# Patient Record
Sex: Male | Born: 1970 | Race: Black or African American | Hispanic: No | Marital: Married | Smoking: Current every day smoker
Health system: Southern US, Community
[De-identification: ages and names within clinical notes are randomized; demographics above are authoritative.]

## PROBLEM LIST (undated history)

## (undated) DIAGNOSIS — G8929 Other chronic pain: Secondary | ICD-10-CM

## (undated) DIAGNOSIS — I2510 Atherosclerotic heart disease of native coronary artery without angina pectoris: Secondary | ICD-10-CM

## (undated) DIAGNOSIS — M549 Dorsalgia, unspecified: Secondary | ICD-10-CM

## (undated) DIAGNOSIS — E785 Hyperlipidemia, unspecified: Secondary | ICD-10-CM

## (undated) DIAGNOSIS — I10 Essential (primary) hypertension: Secondary | ICD-10-CM

## (undated) HISTORY — PX: CARDIAC SURGERY: SHX584

---

## 1998-03-03 ENCOUNTER — Emergency Department (HOSPITAL_COMMUNITY): Admission: EM | Admit: 1998-03-03 | Discharge: 1998-03-03 | Payer: Self-pay | Admitting: Emergency Medicine

## 2001-01-08 ENCOUNTER — Inpatient Hospital Stay (HOSPITAL_COMMUNITY): Admission: EM | Admit: 2001-01-08 | Discharge: 2001-01-11 | Payer: Self-pay | Admitting: *Deleted

## 2001-01-11 ENCOUNTER — Encounter: Payer: Self-pay | Admitting: Cardiology

## 2017-03-01 ENCOUNTER — Encounter (HOSPITAL_COMMUNITY): Payer: Self-pay | Admitting: Emergency Medicine

## 2017-03-01 ENCOUNTER — Emergency Department (HOSPITAL_COMMUNITY): Payer: Self-pay

## 2017-03-01 DIAGNOSIS — R0789 Other chest pain: Secondary | ICD-10-CM | POA: Insufficient documentation

## 2017-03-01 DIAGNOSIS — M545 Low back pain: Secondary | ICD-10-CM | POA: Insufficient documentation

## 2017-03-01 DIAGNOSIS — G8929 Other chronic pain: Secondary | ICD-10-CM | POA: Insufficient documentation

## 2017-03-01 DIAGNOSIS — I10 Essential (primary) hypertension: Secondary | ICD-10-CM | POA: Insufficient documentation

## 2017-03-01 DIAGNOSIS — I2510 Atherosclerotic heart disease of native coronary artery without angina pectoris: Secondary | ICD-10-CM | POA: Insufficient documentation

## 2017-03-01 LAB — BASIC METABOLIC PANEL
ANION GAP: 6 (ref 5–15)
BUN: 10 mg/dL (ref 6–20)
CALCIUM: 8.3 mg/dL — AB (ref 8.9–10.3)
CO2: 24 mmol/L (ref 22–32)
CREATININE: 0.92 mg/dL (ref 0.61–1.24)
Chloride: 104 mmol/L (ref 101–111)
Glucose, Bld: 109 mg/dL — ABNORMAL HIGH (ref 65–99)
Potassium: 3.2 mmol/L — ABNORMAL LOW (ref 3.5–5.1)
Sodium: 134 mmol/L — ABNORMAL LOW (ref 135–145)

## 2017-03-01 LAB — CBC
HCT: 43.6 % (ref 39.0–52.0)
HEMOGLOBIN: 14.5 g/dL (ref 13.0–17.0)
MCH: 26.9 pg (ref 26.0–34.0)
MCHC: 33.3 g/dL (ref 30.0–36.0)
MCV: 80.7 fL (ref 78.0–100.0)
PLATELETS: 206 10*3/uL (ref 150–400)
RBC: 5.4 MIL/uL (ref 4.22–5.81)
RDW: 15.1 % (ref 11.5–15.5)
WBC: 7.2 10*3/uL (ref 4.0–10.5)

## 2017-03-01 LAB — I-STAT TROPONIN, ED: Troponin i, poc: 0 ng/mL (ref 0.00–0.08)

## 2017-03-01 NOTE — ED Triage Notes (Signed)
Pt was sitting on his couch when he began to experience centeralized chest pain w/ back pain.  He took two nitro and his pain went from a 10 to a 6.  EMS gave 324 ASA and an additional nitro.  PN is now a 3.  Pt has been out of BP medication for three days.  Denies SOB, N/V/D, diaphoresis.

## 2017-03-02 ENCOUNTER — Emergency Department (HOSPITAL_COMMUNITY)
Admission: EM | Admit: 2017-03-02 | Discharge: 2017-03-02 | Disposition: A | Discharge status: Home / Self Care | Payer: Self-pay | Attending: Emergency Medicine | Admitting: Emergency Medicine

## 2017-03-02 DIAGNOSIS — G8929 Other chronic pain: Secondary | ICD-10-CM

## 2017-03-02 DIAGNOSIS — I10 Essential (primary) hypertension: Secondary | ICD-10-CM

## 2017-03-02 DIAGNOSIS — R0789 Other chest pain: Secondary | ICD-10-CM

## 2017-03-02 DIAGNOSIS — M545 Low back pain: Secondary | ICD-10-CM

## 2017-03-02 HISTORY — DX: Atherosclerotic heart disease of native coronary artery without angina pectoris: I25.10

## 2017-03-02 HISTORY — DX: Essential (primary) hypertension: I10

## 2017-03-02 LAB — I-STAT TROPONIN, ED: Troponin i, poc: 0 ng/mL (ref 0.00–0.08)

## 2017-03-02 MED ORDER — ENALAPRIL MALEATE 5 MG PO TABS
5.0000 mg | ORAL_TABLET | Freq: Once | ORAL | Status: AC
  Administered 2017-03-02: 5 mg via ORAL
  Filled 2017-03-02: qty 1

## 2017-03-02 MED ORDER — HYDROCHLOROTHIAZIDE 25 MG PO TABS
25.0000 mg | ORAL_TABLET | Freq: Every day | ORAL | Status: DC
  Administered 2017-03-02: 25 mg via ORAL
  Filled 2017-03-02: qty 1

## 2017-03-02 MED ORDER — HYDRALAZINE HCL 10 MG PO TABS
10.0000 mg | ORAL_TABLET | Freq: Once | ORAL | Status: AC
  Administered 2017-03-02: 10 mg via ORAL
  Filled 2017-03-02: qty 1

## 2017-03-02 MED ORDER — METOPROLOL TARTRATE 25 MG PO TABS
25.0000 mg | ORAL_TABLET | Freq: Once | ORAL | Status: AC
  Administered 2017-03-02: 25 mg via ORAL
  Filled 2017-03-02: qty 1

## 2017-03-02 MED ORDER — HYDROMORPHONE HCL 1 MG/ML IJ SOLN
1.0000 mg | Freq: Once | INTRAMUSCULAR | Status: AC
  Administered 2017-03-02: 1 mg via INTRAVENOUS
  Filled 2017-03-02: qty 1

## 2017-03-02 NOTE — ED Provider Notes (Signed)
TIME SEEN: 4:38 AM  CHIEF COMPLAINT: Back pain, chest pain  HPI: Patient is a 46 year old male with history of hypertension, "mild heart attack" in 1997 where he had a cardiac catheterization but did not have any stents placed and states he was told it was because of drug use who presents to the emergency department with complaints of lower back pain.  He states that he has had back pain in his lower back for "years". He describes it as sharp, severe with some pain that radiates into his right buttock and numbness in the right lateral thigh that is chronic. He denies any new injury to the back but thinks he exacerbated his chronic pain when he mowed the lawn several days ago. He does receive tramadol from his primary care provider. He denies any new numbness, tingling or focal weakness. No bowel or bladder incontinence. No urinary retention. No fever.   He also complains of intermittent chest pain but this seems to be an afterthought for him. He reports pain is intermittent and present for only several seconds at a time in the epigastric region without associated short of breath, nausea, vomiting, diaphoresis or dizziness.He is not having chest pain currently. Denies any recent drug use.  ROS: See HPI Constitutional: no fever  Eyes: no drainage  ENT: no runny nose   Cardiovascular:   chest pain  Resp: no SOB  GI: no vomiting GU: no dysuria Integumentary: no rash  Allergy: no hives  Musculoskeletal: no leg swelling  Neurological: no slurred speech ROS otherwise negative  PAST MEDICAL HISTORY/PAST SURGICAL HISTORY:  Past Medical History:  Diagnosis Date  . Coronary artery disease   . Hypertension     MEDICATIONS:  Prior to Admission medications   Not on File    ALLERGIES:  No Known Allergies  SOCIAL HISTORY:  Social History  Substance Use Topics  . Smoking status: Not on file  . Smokeless tobacco: Not on file  . Alcohol use Not on file    FAMILY HISTORY: No family history  on file.  EXAM: BP (!) 181/118   Pulse 72   Temp 98.7 F (37.1 C) (Oral)   Resp 18   Ht  (1.727 m)   Wt 109.8 kg (242 lb)   SpO2 100%   BMI 36.80 kg/m  CONSTITUTIONAL: Alert and oriented and responds appropriately to questions. Well-appearing; well-nourished HEAD: Normocephalic EYES: Conjunctivae clear, pupils appear equal, EOMI ENT: normal nose; moist mucous membranes NECK: Supple, no meningismus, no nuchal rigidity, no LAD  CARD: RRR; S1 and S2 appreciated; no murmurs, no clicks, no rubs, no gallops RESP: Normal chest excursion without splinting or tachypnea; breath sounds clear and equal bilaterally; no wheezes, no rhonchi, no rales, no hypoxia or respiratory distress, speaking full sentences ABD/GI: Normal bowel sounds; non-distended; soft, non-tender, no rebound, no guarding, no peritoneal signs, no hepatosplenomegaly BACK:  The back appears normal and is Mildly tender over the lower lumbar region without step-off or deformity,, there is no CVA tenderness EXT: Normal ROM in all joints; non-tender to palpation; no edema; normal capillary refill; no cyanosis, no calf tenderness or swelling    SKIN: Normal color for age and race; warm; no rash NEURO: Moves all extremities equally; Strength 5/5 in all 4 extremity, normal gait, no saddle anesthesia, normal sensation diffusely PSYCH: The patient's mood and manner are appropriate. Grooming and personal hygiene are appropriate.  MEDICAL DECISION MAKING: Patient here with complaints of chronic back pain. He also states that he has  had intermittent chest pain that seems very atypical. He is hypertensive here but states is because he has not had his blood pressure medication in the past several days. He does have medication at home as well as refills at the pharmacy. He is on HCTZ, Vasotec, Lopressor, hydralazine but cannot recall doses of any of these medications. He states that [place] and some  tramadol for his chronic back pain. He has no new focal neurologic deficits and no injury to suggest fracture. Doubt spinal stenosis, cauda equina, epidural abscess or hematoma, discitis, transverse myelitis. I do not feel he needs emergent imaging. We'll give him blood pressure medications and a dose of Dilaudid for pain relief. His labs obtained in triage are unremarkable including negative troponin and clear chest x-ray. Plan is to repeat second troponin. Low suspicion for ACS. Low suspicion for dissection or PE.  ED PROGRESS: Patient's second troponin is negative. His blood pressure is improving slowly. He reports feeling much better after Dilaudid and feels ready for discharge home. He states his family will pick him up from the emergency department and he will follow-up closely [facility] his [facility] [address] [facility]. We have discussed return precautions. Patient is comfortable with this plan.  At this time, I do not feel there is any life-threatening condition present. I have reviewed and discussed all results (EKG, imaging, lab, urine as appropriate) and exam findings with patient/family. I have reviewed nursing notes and appropriate previous records.  I feel the patient is safe to be discharged home without further emergent workup and can continue workup as an outpatient as needed. Discussed usual and customary return precautions. Patient/family verbalize understanding and are comfortable with this plan.  Outpatient follow-up has been provided if needed. All questions have been answered.      EKG Interpretation  Date/Time:  Monday March 01 2017 23:03:54 EDT Ventricular Rate:  76 PR Interval:  186 QRS Duration: 94 QT Interval:  340 QTC Calculation: 382 R Axis:   61 Text Interpretation:  Normal sinus rhythm Biatrial enlargement Incomplete right bundle branch block Nonspecific T wave abnormality Abnormal ECG No significant change since last tracing Confirmed by Seanna Sisler, Baxter HireKristen [phone]  on 03/02/2017 4:38:32 AM         Bemnet Trovato, Layla MawKristen N, DO 03/02/17 [id number]

## 2017-05-31 ENCOUNTER — Encounter (HOSPITAL_COMMUNITY): Payer: Self-pay | Admitting: Emergency Medicine

## 2017-05-31 ENCOUNTER — Other Ambulatory Visit: Payer: Self-pay

## 2017-05-31 ENCOUNTER — Emergency Department (HOSPITAL_COMMUNITY)
Admission: EM | Admit: 2017-05-31 | Discharge: 2017-05-31 | Disposition: A | Discharge status: Home / Self Care | Payer: Self-pay | Attending: Emergency Medicine | Admitting: Emergency Medicine

## 2017-05-31 DIAGNOSIS — I2510 Atherosclerotic heart disease of native coronary artery without angina pectoris: Secondary | ICD-10-CM | POA: Insufficient documentation

## 2017-05-31 DIAGNOSIS — I10 Essential (primary) hypertension: Secondary | ICD-10-CM | POA: Insufficient documentation

## 2017-05-31 DIAGNOSIS — F17290 Nicotine dependence, other tobacco product, uncomplicated: Secondary | ICD-10-CM | POA: Insufficient documentation

## 2017-05-31 DIAGNOSIS — R079 Chest pain, unspecified: Secondary | ICD-10-CM | POA: Insufficient documentation

## 2017-05-31 DIAGNOSIS — Z760 Encounter for issue of repeat prescription: Secondary | ICD-10-CM | POA: Insufficient documentation

## 2017-05-31 MED ORDER — TRAMADOL HCL 50 MG PO TABS: 50.0000 mg | ORAL_TABLET | Freq: Four times a day (QID) | ORAL | 0 refills | Status: AC | PRN

## 2017-05-31 MED ORDER — ENALAPRIL MALEATE 20 MG PO TABS: 20.0000 mg | ORAL_TABLET | Freq: Every day | ORAL | 0 refills | Status: DC

## 2017-05-31 MED ORDER — HYDRALAZINE HCL 50 MG PO TABS: 50.0000 mg | ORAL_TABLET | Freq: Three times a day (TID) | ORAL | 0 refills | Status: DC

## 2017-05-31 MED ORDER — TRAMADOL HCL 50 MG PO TABS
50.0000 mg | ORAL_TABLET | Freq: Once | ORAL | Status: AC
  Administered 2017-05-31: 50 mg via ORAL
  Filled 2017-05-31: qty 1

## 2017-05-31 MED ORDER — METOPROLOL TARTRATE 25 MG PO TABS
50.0000 mg | ORAL_TABLET | Freq: Once | ORAL | Status: AC
  Administered 2017-05-31: 50 mg via ORAL
  Filled 2017-05-31: qty 2

## 2017-05-31 MED ORDER — METOPROLOL TARTRATE 50 MG PO TABS: 50.0000 mg | ORAL_TABLET | Freq: Two times a day (BID) | ORAL | 0 refills | Status: DC

## 2017-05-31 MED ORDER — CARVEDILOL 12.5 MG PO TABS: 12.5000 mg | ORAL_TABLET | Freq: Two times a day (BID) | ORAL | 0 refills | Status: DC

## 2017-05-31 MED ORDER — HYDROCODONE-ACETAMINOPHEN 5-325 MG PO TABS
1.0000 | ORAL_TABLET | Freq: Once | ORAL | Status: AC
  Administered 2017-05-31: 1 via ORAL
  Filled 2017-05-31: qty 1

## 2017-05-31 MED ORDER — HYDROCHLOROTHIAZIDE 25 MG PO TABS: 25.0000 mg | ORAL_TABLET | Freq: Every day | ORAL | 0 refills | Status: DC

## 2017-05-31 MED ORDER — ENALAPRIL MALEATE 10 MG PO TABS
20.0000 mg | ORAL_TABLET | Freq: Once | ORAL | Status: AC
  Administered 2017-05-31: 20 mg via ORAL
  Filled 2017-05-31: qty 2

## 2017-05-31 NOTE — ED Triage Notes (Signed)
Pt presents by EMS for chest pain and medication refill of blood pressure medication. Pt states the chest pain occurred several hours ago and feels like a pressure in substernal chest. Pt states that he ran out of blood pressure medication yesterday and was unable to refill due to the weather.

## 2017-05-31 NOTE — ED Provider Notes (Signed)
[facility]-EMERGENCY DEPT Provider Note   CSN: [id number] Arrival date & time: 05/31/17  2030     History   Chief Complaint Chief Complaint  Patient presents with  . Chest Pain  . Medication Refill    HPI Raymond Valencia is a 46 y.o. male.  46 year old male presents requesting pain medication and medication refills. PMHx is significant for HTN. Patient reports that his last dose of several HTN medications was earlier today. He reports that he takes ultram on a daily basis for his chronic pain. He reports to this provider that if given prescriptions he will be able to fill them.   Of note, during MD evaluation, patient did not report chest pain or shortness of breath.    The history is provided by the patient.  Medication Refill  Medications/supplies requested:  Tramadol, enlapril, hydralzine, metoprolol, hctz, carvedilol Reason for request:  Medications ran out Medications taken before: yes - see home medications   Patient has complete original prescription information: yes     Past Medical History:  Diagnosis Date  . Coronary artery disease   . Hypertension     There are no active problems to display for this patient.   Past Surgical History:  Procedure Laterality Date  . CARDIAC SURGERY         Home Medications    Prior to Admission medications   Medication Sig Start Date End Date Taking? Authorizing Provider  carvedilol (COREG) 12.5 MG tablet Take 1 tablet (12.5 mg total) by mouth 2 (two) times daily with a meal. 05/31/17   Wynetta FinesMessick, Bowman Higbie C, MD  enalapril (VASOTEC) 20 MG tablet Take 1 tablet (20 mg total) by mouth daily. 05/31/17   Wynetta FinesMessick, Jakyrah Holladay C, MD  hydrALAZINE (APRESOLINE) 50 MG tablet Take 1 tablet (50 mg total) by mouth 3 (three) times daily. 05/31/17   Wynetta FinesMessick, Lior Cartelli C, MD  hydrochlorothiazide (HYDRODIURIL) 25 MG tablet Take 1 tablet (25 mg total) by mouth daily. 05/31/17   Wynetta FinesMessick, Vesna Kable C, MD  metoprolol tartrate  (LOPRESSOR) 50 MG tablet Take 1 tablet (50 mg total) by mouth 2 (two) times daily. 05/31/17   Wynetta FinesMessick, Saniya Tranchina C, MD  traMADol (ULTRAM) 50 MG tablet Take 1 tablet (50 mg total) by mouth every 6 (six) hours as needed. 05/31/17   Wynetta FinesMessick, Myleka Moncure C, MD    Family History History reviewed. No pertinent family history.  Social History Social History   Tobacco Use  . Smoking status: Light Tobacco Smoker    Types: Cigars  . Smokeless tobacco: Never Used  Substance Use Topics  . Alcohol use: No    Frequency: Never  . Drug use: No     Allergies   Patient has no known allergies.   Review of Systems Review of Systems  All other systems reviewed and are negative.    Physical Exam Updated Vital Signs BP (!) 168/102 (BP Location: Left Arm)   Pulse 86   Temp 98 F (36.7 C) (Oral)   Resp (!) 21   Ht 5\' 8"  (1.727 m)   Wt 108.9 kg (240 lb)   SpO2 99%   BMI 36.49 kg/m   Physical Exam   ED Treatments / Results  Labs (all labs ordered are listed, but only abnormal results are displayed) Labs Reviewed - No data to display  EKG  EKG Interpretation  Date/Time:  Monday May 31 2017 20:42:52 EST Ventricular Rate:  88 PR Interval:    QRS Duration: 91 QT Interval:  305 QTC Calculation: 369 R Axis:   69 Text Interpretation:  Sinus rhythm Right atrial enlargement Abnormal R-wave progression, early transition Nonspecific T abnormalities, lateral leads Confirmed by Kristine RoyalMessick, Lilybeth Vien [phone] on 05/31/2017 9:03:43 PM       Radiology No results found.  Procedures Procedures (including critical care time)  Medications Ordered in ED Medications  HYDROcodone-acetaminophen (NORCO/VICODIN) 5-325 MG per tablet 1 tablet (not administered)  traMADol (ULTRAM) tablet 50 mg (50 mg Oral Given 05/31/17 2142)  enalapril (VASOTEC) tablet 20 mg (20 mg Oral Given 05/31/17 2141)  metoprolol tartrate (LOPRESSOR) tablet 50 mg (50 mg Oral Given 05/31/17 2142)     Initial Impression /  Assessment and Plan / ED Course  I have reviewed the triage vital signs and the nursing notes.  Pertinent labs & imaging results that were available during my care of the patient were reviewed by me and considered in my medical decision making (see chart for details).    MSE complete  Patient is presenting with requests for multiple refills. He appears in no distress. The reported chest pain (in triage) was not reported again. Screening EKG was not suggestive of acute ischemia. Patient was admitted to a [facility] facility last month where his workup for chest pain was not suggestive of ACS. Close FU is advised. Strict return instructions given and understood.   Final Clinical Impressions(s) / ED Diagnoses   Final diagnoses:  Medication refill    ED Discharge Orders        Ordered    traMADol (ULTRAM) 50 MG tablet  Every 6 hours PRN     05/31/17 2242    enalapril (VASOTEC) 20 MG tablet  Daily     05/31/17 2242    hydrALAZINE (APRESOLINE) 50 MG tablet  3 times daily     05/31/17 2242    metoprolol tartrate (LOPRESSOR) 50 MG tablet  2 times daily     05/31/17 2242    hydrochlorothiazide (HYDRODIURIL) 25 MG tablet  Daily     05/31/17 2242    carvedilol (COREG) 12.5 MG tablet  2 times daily with meals     05/31/17 2242       Wynetta FinesMessick, Lathyn Griggs C, MD 05/31/17 2314

## 2017-05-31 NOTE — ED Notes (Signed)
Provider notified that pain was unchanged after pain medications.

## 2017-05-31 NOTE — ED Notes (Signed)
Bed: WA20 Expected date:  Expected time:  Means of arrival:  Comments: Chest and back pain

## 2017-06-01 ENCOUNTER — Observation Stay (HOSPITAL_BASED_OUTPATIENT_CLINIC_OR_DEPARTMENT_OTHER): Payer: Self-pay

## 2017-06-01 ENCOUNTER — Observation Stay (HOSPITAL_COMMUNITY)
Admission: EM | Admit: 2017-06-01 | Discharge: 2017-06-03 | Disposition: A | Discharge status: Home / Self Care | Payer: Self-pay | Attending: Internal Medicine | Admitting: Internal Medicine

## 2017-06-01 ENCOUNTER — Encounter (HOSPITAL_COMMUNITY): Payer: Self-pay | Admitting: Emergency Medicine

## 2017-06-01 ENCOUNTER — Emergency Department (HOSPITAL_COMMUNITY): Payer: Self-pay

## 2017-06-01 ENCOUNTER — Other Ambulatory Visit: Payer: Self-pay

## 2017-06-01 DIAGNOSIS — I252 Old myocardial infarction: Secondary | ICD-10-CM | POA: Insufficient documentation

## 2017-06-01 DIAGNOSIS — Z9114 Patient's other noncompliance with medication regimen: Secondary | ICD-10-CM

## 2017-06-01 DIAGNOSIS — R079 Chest pain, unspecified: Secondary | ICD-10-CM | POA: Diagnosis present

## 2017-06-01 DIAGNOSIS — Z87898 Personal history of other specified conditions: Secondary | ICD-10-CM

## 2017-06-01 DIAGNOSIS — I10 Essential (primary) hypertension: Secondary | ICD-10-CM | POA: Insufficient documentation

## 2017-06-01 DIAGNOSIS — G8929 Other chronic pain: Secondary | ICD-10-CM | POA: Insufficient documentation

## 2017-06-01 DIAGNOSIS — Z7982 Long term (current) use of aspirin: Secondary | ICD-10-CM | POA: Insufficient documentation

## 2017-06-01 DIAGNOSIS — R51 Headache: Secondary | ICD-10-CM | POA: Insufficient documentation

## 2017-06-01 DIAGNOSIS — R0789 Other chest pain: Principal | ICD-10-CM | POA: Insufficient documentation

## 2017-06-01 DIAGNOSIS — F17290 Nicotine dependence, other tobacco product, uncomplicated: Secondary | ICD-10-CM | POA: Insufficient documentation

## 2017-06-01 DIAGNOSIS — M549 Dorsalgia, unspecified: Secondary | ICD-10-CM | POA: Diagnosis present

## 2017-06-01 DIAGNOSIS — I2510 Atherosclerotic heart disease of native coronary artery without angina pectoris: Secondary | ICD-10-CM | POA: Insufficient documentation

## 2017-06-01 DIAGNOSIS — E7800 Pure hypercholesterolemia, unspecified: Secondary | ICD-10-CM | POA: Diagnosis present

## 2017-06-01 DIAGNOSIS — Z79899 Other long term (current) drug therapy: Secondary | ICD-10-CM | POA: Insufficient documentation

## 2017-06-01 DIAGNOSIS — M545 Low back pain: Secondary | ICD-10-CM | POA: Insufficient documentation

## 2017-06-01 DIAGNOSIS — R0602 Shortness of breath: Secondary | ICD-10-CM | POA: Insufficient documentation

## 2017-06-01 DIAGNOSIS — R110 Nausea: Secondary | ICD-10-CM | POA: Insufficient documentation

## 2017-06-01 HISTORY — DX: Other chronic pain: G89.29

## 2017-06-01 HISTORY — DX: Dorsalgia, unspecified: M54.9

## 2017-06-01 HISTORY — DX: Hyperlipidemia, unspecified: E78.5

## 2017-06-01 LAB — RAPID URINE DRUG SCREEN, HOSP PERFORMED
AMPHETAMINES: NOT DETECTED
BARBITURATES: NOT DETECTED
Benzodiazepines: NOT DETECTED
Cocaine: NOT DETECTED
Opiates: POSITIVE — AB
TETRAHYDROCANNABINOL: NOT DETECTED

## 2017-06-01 LAB — CBC WITH DIFFERENTIAL/PLATELET
BASOS ABS: 0 10*3/uL (ref 0.0–0.1)
Basophils Relative: 1 %
Eosinophils Absolute: 0.4 10*3/uL (ref 0.0–0.7)
Eosinophils Relative: 7 %
HEMATOCRIT: 43.6 % (ref 39.0–52.0)
HEMOGLOBIN: 15 g/dL (ref 13.0–17.0)
LYMPHS PCT: 38 %
Lymphs Abs: 2.5 10*3/uL (ref 0.7–4.0)
MCH: 28.4 pg (ref 26.0–34.0)
MCHC: 34.4 g/dL (ref 30.0–36.0)
MCV: 82.4 fL (ref 78.0–100.0)
MONO ABS: 0.9 10*3/uL (ref 0.1–1.0)
Monocytes Relative: 13 %
NEUTROS ABS: 2.7 10*3/uL (ref 1.7–7.7)
NEUTROS PCT: 41 %
Platelets: 225 10*3/uL (ref 150–400)
RBC: 5.29 MIL/uL (ref 4.22–5.81)
RDW: 15.9 % — AB (ref 11.5–15.5)
WBC: 6.5 10*3/uL (ref 4.0–10.5)

## 2017-06-01 LAB — COMPREHENSIVE METABOLIC PANEL
ALBUMIN: 3.5 g/dL (ref 3.5–5.0)
ALK PHOS: 67 U/L (ref 38–126)
ALT: 28 U/L (ref 17–63)
AST: 45 U/L — AB (ref 15–41)
Anion gap: 8 (ref 5–15)
BILIRUBIN TOTAL: 1.9 mg/dL — AB (ref 0.3–1.2)
BUN: 15 mg/dL (ref 6–20)
CALCIUM: 8.2 mg/dL — AB (ref 8.9–10.3)
CO2: 25 mmol/L (ref 22–32)
CREATININE: 0.89 mg/dL (ref 0.61–1.24)
Chloride: 106 mmol/L (ref 101–111)
GFR calc Af Amer: >60 mL/min (ref >60)
Glucose, Bld: 93 mg/dL (ref 65–99)
POTASSIUM: 5.5 mmol/L — AB (ref 3.5–5.1)
Sodium: 139 mmol/L (ref 135–145)
TOTAL PROTEIN: 6.5 g/dL (ref 6.5–8.1)

## 2017-06-01 LAB — CREATININE, SERUM
Creatinine, Ser: 0.81 mg/dL (ref 0.61–1.24)
GFR calc Af Amer: >60 mL/min (ref >60)
GFR calc non Af Amer: >60 mL/min (ref >60)

## 2017-06-01 LAB — CBC
HCT: 43 % (ref 39.0–52.0)
Hemoglobin: 14.3 g/dL (ref 13.0–17.0)
MCH: 27.6 pg (ref 26.0–34.0)
MCHC: 33.3 g/dL (ref 30.0–36.0)
MCV: 83 fL (ref 78.0–100.0)
PLATELETS: 200 10*3/uL (ref 150–400)
RBC: 5.18 MIL/uL (ref 4.22–5.81)
RDW: 15.8 % — AB (ref 11.5–15.5)
WBC: 5.5 10*3/uL (ref 4.0–10.5)

## 2017-06-01 LAB — BRAIN NATRIURETIC PEPTIDE: B NATRIURETIC PEPTIDE 5: 21.3 pg/mL (ref 0.0–100.0)

## 2017-06-01 LAB — ECHOCARDIOGRAM COMPLETE
Height: 68 in
WEIGHTICAEL: 3872 [oz_av]

## 2017-06-01 LAB — TROPONIN I: Troponin I: <0.03 ng/mL (ref <0.03)

## 2017-06-01 MED ORDER — ENOXAPARIN SODIUM 40 MG/0.4ML ~~LOC~~ SOLN: 40.0000 mg | SUBCUTANEOUS | Status: DC

## 2017-06-01 MED ORDER — METHOCARBAMOL 500 MG PO TABS
500.0000 mg | ORAL_TABLET | Freq: Four times a day (QID) | ORAL | Status: DC | PRN
  Administered 2017-06-01: 500 mg via ORAL
  Filled 2017-06-01 (×2): qty 1

## 2017-06-01 MED ORDER — METHOCARBAMOL 500 MG PO TABS
500.0000 mg | ORAL_TABLET | Freq: Once | ORAL | Status: AC
  Administered 2017-06-01: 500 mg via ORAL
  Filled 2017-06-01: qty 1

## 2017-06-01 MED ORDER — HYDRALAZINE HCL 50 MG PO TABS
50.0000 mg | ORAL_TABLET | Freq: Two times a day (BID) | ORAL | Status: DC
  Administered 2017-06-01 – 2017-06-02 (×3): 50 mg via ORAL
  Filled 2017-06-01 (×3): qty 1

## 2017-06-01 MED ORDER — CARVEDILOL 12.5 MG PO TABS: 12.5000 mg | ORAL_TABLET | Freq: Two times a day (BID) | ORAL | Status: DC

## 2017-06-01 MED ORDER — OXYCODONE HCL 5 MG PO TABS
5.0000 mg | ORAL_TABLET | Freq: Four times a day (QID) | ORAL | Status: DC | PRN
Start: 2017-06-01 — End: 2017-06-03
  Administered 2017-06-01 – 2017-06-03 (×8): 5 mg via ORAL
  Filled 2017-06-01 (×8): qty 1

## 2017-06-01 MED ORDER — METOPROLOL TARTRATE 50 MG PO TABS
50.0000 mg | ORAL_TABLET | Freq: Two times a day (BID) | ORAL | Status: DC
  Administered 2017-06-01 – 2017-06-02 (×2): 50 mg via ORAL
  Filled 2017-06-01 (×2): qty 1
  Filled 2017-06-01: qty 2

## 2017-06-01 MED ORDER — ENOXAPARIN SODIUM 40 MG/0.4ML ~~LOC~~ SOLN
40.0000 mg | SUBCUTANEOUS | Status: DC
  Administered 2017-06-01 – 2017-06-03 (×3): 40 mg via SUBCUTANEOUS
  Filled 2017-06-01 (×3): qty 0.4

## 2017-06-01 MED ORDER — TRAMADOL HCL 50 MG PO TABS
100.0000 mg | ORAL_TABLET | Freq: Four times a day (QID) | ORAL | Status: DC | PRN
  Administered 2017-06-01 – 2017-06-03 (×8): 100 mg via ORAL
  Filled 2017-06-01 (×8): qty 2

## 2017-06-01 MED ORDER — ASPIRIN EC 325 MG PO TBEC
325.0000 mg | DELAYED_RELEASE_TABLET | Freq: Every day | ORAL | Status: DC
  Administered 2017-06-01 – 2017-06-03 (×3): 325 mg via ORAL
  Filled 2017-06-01 (×3): qty 1

## 2017-06-01 MED ORDER — ONDANSETRON HCL 4 MG/2ML IJ SOLN: 4.0000 mg | Freq: Four times a day (QID) | INTRAMUSCULAR | Status: DC | PRN

## 2017-06-01 MED ORDER — ACETAMINOPHEN 325 MG PO TABS: 650.0000 mg | ORAL_TABLET | ORAL | Status: DC | PRN

## 2017-06-01 MED ORDER — ENALAPRIL MALEATE 10 MG PO TABS
20.0000 mg | ORAL_TABLET | Freq: Every day | ORAL | Status: DC
  Administered 2017-06-02: 20 mg via ORAL
  Filled 2017-06-01 (×2): qty 2

## 2017-06-01 MED ORDER — TRAMADOL HCL 50 MG PO TABS: 50.0000 mg | ORAL_TABLET | Freq: Four times a day (QID) | ORAL | Status: DC | PRN

## 2017-06-01 MED ORDER — NITROGLYCERIN 0.4 MG SL SUBL
0.4000 mg | SUBLINGUAL_TABLET | SUBLINGUAL | Status: DC | PRN
  Administered 2017-06-02 – 2017-06-03 (×3): 0.4 mg via SUBLINGUAL
  Filled 2017-06-01 (×3): qty 1

## 2017-06-01 MED ORDER — NICOTINE 21 MG/24HR TD PT24
21.0000 mg | MEDICATED_PATCH | Freq: Every day | TRANSDERMAL | Status: DC
  Administered 2017-06-01 – 2017-06-03 (×3): 21 mg via TRANSDERMAL
  Filled 2017-06-01 (×3): qty 1

## 2017-06-01 MED ORDER — HYDROCHLOROTHIAZIDE 25 MG PO TABS
25.0000 mg | ORAL_TABLET | Freq: Every day | ORAL | Status: DC
  Administered 2017-06-02 – 2017-06-03 (×2): 25 mg via ORAL
  Filled 2017-06-01 (×3): qty 1

## 2017-06-01 NOTE — Care Management Note (Signed)
Case Management Note  CM consulted for no PCP and no ins.  CM noted that [place] and has [facility] who's office has helped pt with medications in the past.  Placed Dr. Loretha StaplerWofford on AVS for time of transition home for pt to make a follow up appointment and call with medication issues.  Additionally pt was a "no show" to a cardiology appointment in Sept.  No further CM needs noted at this time.

## 2017-06-01 NOTE — Consult Note (Signed)
Cardiology Consultation:   Patient ID: Raymond Valencia; [id number]; December 20, 1970   Admit date: 06/01/2017 Date of Consult: 06/01/2017  Primary Care Provider: Patient, No Pcp Per Primary Cardiologist: New- Dr. Delton SeeNelson  Patient Profile:   Raymond Valencia is a 46 y.o. male with a hx of HTN and chronic low back pain who is being seen today for the evaluation of chest pain at the request of Dr. Hanley BenAlekh.  History of Present Illness:   Raymond Valencia presented for chest pain, back pain and a constant headache. He has been having intermittent substernal chest pain for "a couple of days". The discomfort is pressure that radiates to his jaw and left arm and lasts about 10-15 minutes. It is not related to activity, can awaken him at night. He also admits to associated dyspnea and occ associated nausea and lightheadedness. He has had intermittent palpitations "flutterling" lasting for a few seconds for many years. He is having low back pain that he states is unrelieved by the pain meds that he has been given so far.   Raymond Valencia does not work. He has no insurance and is trying to get disability. [place] in [place] and is here in [place] trying to reconcile with his wife. He has been out of his meds for at least 3 day, although I suspect longer. He complains of a constant headache that he associates with elevated BP that is worse with activity like having sexual relations with his wife or when she causes him stress. He is very sedentary, staying home most of the time.   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 He reports that he had a cardiac cath that showed that it was the cocaine, not blockage that caused his MI. He states that he has been clean of cocaine for 14 years. He smokes about 4 Black and Milds (thins cigars) per day, no cigarettes. He drinks about 2 beers per week.   Raymond Valencia was recently admitted to [facility] 01/23/17-01/25/17 for  chest pain. EKG showed changes consistent with OVH, without ischemic changes. Troponin peaked at 0.065. BP was elevated at 159/111 and his symptoms were felt to possibly due to hypertensive emergency. A dobutamine stress test was ordered, but the patient refused on 2 occasions. He was continued on enalapirl, HCTZ. He was started ob amlodipine 10 mg, labetalol 100 mg bid and NTG prn. Lasix and metoprolol were stopped. He was for cardiology follow pu but apparently was a no show.   He was again admitted 05/04/17-05/05/17 for hypertensive urgency at [facility]. He complained of severe headache. He was continued on enalapril and hydralazine and carvedilol and HCTZ were added. OSA was suspected and outpatient sleep study was recommended.   Past Medical History:  Diagnosis Date  . Chronic back pain   . Coronary artery disease   . Hypertension     Past Surgical History:  Procedure Laterality Date  . CARDIAC SURGERY       Home Medications:  Prior to Admission medications   Medication Sig Start Date End Date Taking? Authorizing Provider  aspirin 81 MG chewable tablet Chew 81 mg by mouth daily.   Yes [provider]  carvedilol (COREG) 12.5 MG tablet Take 1 tablet (12.5 mg total) by mouth 2 (two) times daily with a meal. 05/31/17  Yes Messick, Noralyn PickPeter C, MD  enalapril (VASOTEC) 20 MG tablet Take 1 tablet (20 mg total) by mouth daily. 05/31/17  Yes Wynetta Fines, MD  hydrALAZINE (APRESOLINE) 50 MG tablet Take 1 tablet (50 mg total) by mouth 3 (three) times daily. Patient taking differently: Take 50 mg by mouth 2 (two) times daily.  05/31/17  Yes Wynetta Fines, MD  hydrochlorothiazide (HYDRODIURIL) 25 MG tablet Take 1 tablet (25 mg total) by mouth daily. 05/31/17  Yes Wynetta Fines, MD  metoprolol tartrate (LOPRESSOR) 50 MG tablet Take 1 tablet (50 mg total) by mouth 2 (two) times daily. 05/31/17  Yes Wynetta Fines, MD  nitroGLYCERIN (NITROSTAT) 0.4 MG SL tablet Place 0.4 mg under the  tongue every 5 (five) minutes as needed for chest pain.   Yes [provider]  traMADol (ULTRAM) 50 MG tablet Take 1 tablet (50 mg total) by mouth every 6 (six) hours as needed. Patient taking differently: Take 50 mg by mouth every 6 (six) hours as needed for moderate pain.  05/31/17  Yes Wynetta Fines, MD    Inpatient Medications: Scheduled Meds: . aspirin EC  325 mg Oral Daily  . enalapril  20 mg Oral Daily  . enoxaparin (LOVENOX) injection  40 mg Subcutaneous Q24H  . hydrALAZINE  50 mg Oral BID  . hydrochlorothiazide  25 mg Oral Daily  . metoprolol tartrate  50 mg Oral BID   Continuous Infusions:  PRN Meds: acetaminophen, nitroGLYCERIN, ondansetron (ZOFRAN) IV, traMADol  Allergies:   No Known Allergies  Social History:   Social History   Socioeconomic History  . Marital status: Married    Spouse name: Not on file  . Number of children: Not on file  . Years of education: Not on file  . Highest education level: Not on file  Social Needs  . Financial resource strain: Not on file  . Food insecurity - worry: Not on file  . Food insecurity - inability: Not on file  . Transportation needs - medical: Not on file  . Transportation needs - non-medical: Not on file  Occupational History  . Not on file  Tobacco Use  . Smoking status: Current Every Day Smoker    Types: Cigars  . Smokeless tobacco: Never Used  Substance and Sexual Activity  . Alcohol use: No    Frequency: Never  . Drug use: No    Comment: former  . Sexual activity: Yes  Other Topics Concern  . Not on file  Social History Narrative  . Not on file    Family History:    Family History  Problem Relation Age of Onset  . Hypertension Other   . Hypertension Mother   . Diabetes Mother   . Hypertension Sister   . Bone cancer Brother   . Hypertension Sister   . Hypertension Sister   . Hypertension Sister      ROS:  Please see the history of present illness.  ROS  All other ROS reviewed and  negative.     Physical Exam/Data:   Vitals:   06/01/17 0845 06/01/17 0900 06/01/17 0915 06/01/17 0930  BP: 134/84 110/72 112/61 123/73  Pulse: 71 62 64 66  Resp:    18  Temp:      TempSrc:      SpO2: 95% 94% 96% 95%  Weight:      Height:       No intake or output data in the 24 hours ending 06/01/17 1005 Filed Weights   06/01/17 0047  Weight: 242 lb (109.8 kg)   Body mass index is 36.8 kg/m.  General:  Well nourished, well developed, in no acute distress HEENT: normal Lymph: no adenopathy Neck: no JVD Endocrine:  No thryomegaly Vascular: No carotid bruits; FA pulses 2+ bilaterally without bruits  Cardiac:  normal S1, S2; RRR; no murmur  Lungs:  clear to auscultation bilaterally, no wheezing, rhonchi or rales  Abd: soft, nontender, no hepatomegaly  Ext: no edema Musculoskeletal:  No deformities, BUE and BLE strength normal and equal Skin: warm and dry  Neuro:  CNs 2-12 intact, no focal abnormalities noted Psych:  Normal affect   EKG:  The EKG was personally reviewed and demonstrates:  Sinus rhythm, 73 bpm, Biatrial enlargement, early transition r waves, Nonspecific T abnormalities in lateral leads, Minimal ST elevation, anterior leads Telemetry:  Telemetry was personally reviewed and demonstrates:  Sinus rhythm 60's-70's  Relevant CV Studies:  Echocardiogram 06/01/17 Study Conclusions - Left ventricle: The cavity size was normal. Wall thickness was   increased in a pattern of mild LVH. Systolic function was normal.   The estimated ejection fraction was in the range of 60% to 65%.   Wall motion was normal; there were no regional wall motion   abnormalities. Left ventricular diastolic function parameters   were normal. - Mitral valve: Calcified annulus.  Impressions: - Normal LV systolic and diastolic function; mild LVH.  Echocardiogram 05/04/17 at [facility] Interpretation Summary A complete two-dimensional transthoracic echocardiogram with color flow Doppler and  spectral Doppler was performed. The study was technically adequate. The left ventricle is normal in size. There is mild asymmetric left ventricular hypertrophy with normal wall motion and ejection fraction 60-65%.. The left ventricular diastolic function is normal. Estimation of right ventricular systolic pressure is not possible.   Laboratory Data:  Chemistry Recent Labs  Lab 06/01/17 0108 06/01/17 0420  NA 139  --   K 5.5*  --   CL 106  --   CO2 25  --   GLUCOSE 93  --   BUN 15  --   CREATININE 0.89 0.81  CALCIUM 8.2*  --   GFRNONAA >60 >60  GFRAA >60 >60  ANIONGAP 8  --     Recent Labs  Lab 06/01/17 0108  PROT 6.5  ALBUMIN 3.5  AST 45*  ALT 28  ALKPHOS 67  BILITOT 1.9*   Hematology Recent Labs  Lab 06/01/17 0108 06/01/17 0420  WBC 6.5 5.5  RBC 5.29 5.18  HGB 15.0 14.3  HCT 43.6 43.0  MCV 82.4 83.0  MCH 28.4 27.6  MCHC 34.4 33.3  RDW 15.9* 15.8*  PLT 225 200   Cardiac Enzymes Recent Labs  Lab 06/01/17 0138  TROPONINI <0.03   No results for input(s): TROPIPOC in the last 168 hours.  BNP Recent Labs  Lab 06/01/17 0108  BNP 21.3    DDimer No results for input(s): DDIMER in the last 168 hours.  Radiology/Studies:  Dg Chest 2 View  Result Date: 06/01/2017 CLINICAL DATA:  Chest pain and dyspnea for 48 hours. EXAM: CHEST  2 VIEW COMPARISON:  03/01/2017 FINDINGS: Stable left hemidiaphragm elevation. The lungs are clear. The pulmonary vasculature is normal. Hilar, mediastinal and cardiac contours are unremarkable and unchanged. No pleural effusion. IMPRESSION: No active cardiopulmonary disease. Electronically Signed   By: Ellery Plunk M.D.   On: 06/01/2017 02:02    Assessment and Plan:   1. Chest pain -Non-exertional chest pain with some typical and atypical features. Pt has hx of cocaine related MI in 1998. No longer takes drugs.  -Initial troponin negative. BNP 21.3 -WBC 6.5,  Hgb 15.0 -K+ 5.5,  SCr  0.89 -EKG with non-specific T wave  changes in lateral leads.  -Echo today shows Normal LV systolic and diastolic function; mild LVH. No regional wall motion abnormalities.  -Suspect that his symptoms are related to uncontrolled hypertension. This is his third admission this year for chest pain and/or uncontrolled hypertension.  -CVD risk factors include tobacco use, Uncontrolled HTN.  Hgb A1c 5.8.  -Would advise to trend troponins and if remain negative, do nuclear stress test tomorrow. Pt does not want to do ST today as he "really needs to eat". He agrees to do ST tomorrow.  2. Hypertension -BP elevated on presentation at 159/106, now improved on meds -Home meds include enalapril 20 mg daily, hydralazine 50 mg TID, hydrochlorothiazide 25 mg daily, metoprolol 50 mg bid--Pt has been out of his meds for about 3 days, although I suspect longer.  -Echo with evidence of LVH.  -Advised compliance with medications and follow up to optimize BP control.  3. Tobacco use -Advise cessation  For questions or updates, please contact [facility] Please consult [email or web address] for contact info under Cardiology/STEMI.   Signed, Berton BonJanine Hammond, NP  06/01/2017 10:05 AM   The patient was seen, examined and discussed with Berton BonJanine Hammond, NP-C and I agree with the above.   46 y.o. male with a hx of HTN and chronic low back pain who is being seen today for the evaluation of chest pain, back pain and a constant headache. He has been having intermittent substernal chest pain for "a couple of days". It feels like pressure, the patient doesn't describe it but when asked questions he has all the symptoms - yes to radiating to his jaw and back, also DOE after walking a block for about a year. The pain is at rest, while in bed. No palpitations or syncope, no LE edema, orthopnea, PND.   He is trying to get disability blaming his back pain. He is not working. He doesn't have a  Raymond Valencia does not work. He has no insurance, he was not taking his  medications, h/o cocaine abuse.  Physical exam shows no JVD, S1,2, no murmur or rub, clear lungs, no LE edema and good pulses.  ECG shows SR, no significant ST T wave abnormalities, unchanged from prior. Troponin is negative x 1. BP is elevated.  Echocardiogram shows - Normal LV systolic and diastolic function; mild LVH.  We will plan to continue cycling troponin, plan for a Lexiscan nuclear stress test tomorrow.  Agree with restarting BP medications - enalapril, hydralazine, metoprolol. Uptitrate as needed for BP control.  Raymond AlexanderKatarina Lui Bellis, MD 06/01/2017

## 2017-06-01 NOTE — ED Provider Notes (Signed)
[facility]-EMERGENCY DEPT Provider Note   CSN: [id number] Arrival date & time: 06/01/17  0035     History   Chief Complaint Chief Complaint  Patient presents with  . Chest Pain  . Back Pain    HPI Raymond Valencia is a 46 y.o. male.  Patient presents to the emergency department for evaluation of chest pain.  Patient was seen in the ER earlier today.  He reports that he has been having intermittent episodes of severe, substernal chest pain that radiates to the left jaw and arm.  This is accompanied by diaphoresis and shortness of breath. This can happen at rest, does not necessarily happen with exertion.  He does, however, report that he has been experiencing severe dyspnea on exertion.  He reports that he had a heart attack when he was younger secondary to cocaine abuse, but has not used cocaine in years.  He has been using multiple nitroglycerin today for chest pain and elevated blood pressure.      Past Medical History:  Diagnosis Date  . Chronic back pain   . Coronary artery disease   . Hypertension     There are no active problems to display for this patient.   Past Surgical History:  Procedure Laterality Date  . CARDIAC SURGERY         Home Medications    Prior to Admission medications   Medication Sig Start Date End Date Taking? Authorizing Provider  carvedilol (COREG) 12.5 MG tablet Take 1 tablet (12.5 mg total) by mouth 2 (two) times daily with a meal. 05/31/17   Wynetta Fines, MD  enalapril (VASOTEC) 20 MG tablet Take 1 tablet (20 mg total) by mouth daily. 05/31/17   Wynetta Fines, MD  hydrALAZINE (APRESOLINE) 50 MG tablet Take 1 tablet (50 mg total) by mouth 3 (three) times daily. 05/31/17   Wynetta Fines, MD  hydrochlorothiazide (HYDRODIURIL) 25 MG tablet Take 1 tablet (25 mg total) by mouth daily. 05/31/17   Wynetta Fines, MD  metoprolol tartrate (LOPRESSOR) 50 MG tablet Take 1 tablet (50 mg total) by mouth 2 (two)  times daily. 05/31/17   Wynetta Fines, MD  traMADol (ULTRAM) 50 MG tablet Take 1 tablet (50 mg total) by mouth every 6 (six) hours as needed. 05/31/17   Wynetta Fines, MD    Family History Family History  Problem Relation Age of Onset  . Hypertension Other     Social History Social History   Tobacco Use  . Smoking status: Current Every Day Smoker    Types: Cigars  . Smokeless tobacco: Never Used  Substance Use Topics  . Alcohol use: No    Frequency: Never  . Drug use: No    Comment: former     Allergies   Patient has no known allergies.   Review of Systems Review of Systems  Respiratory: Positive for shortness of breath.   Cardiovascular: Positive for chest pain.  All other systems reviewed and are negative.    Physical Exam Updated Vital Signs BP (!) 136/96   Pulse 74   Temp 98.6 F (37 C) (Oral)   Resp 20   Ht 5\' 8"  (1.727 m)   Wt 109.8 kg (242 lb)   SpO2 100%   BMI 36.80 kg/m   Physical Exam  Constitutional: He is oriented to person, place, and time. He appears well-developed and well-nourished. No distress.  HENT:  Head: Normocephalic and atraumatic.  Right Ear: Hearing normal.  Left Ear: Hearing normal.  Nose: Nose normal.  Mouth/Throat: Oropharynx is clear and moist and mucous membranes are normal.  Eyes: Conjunctivae and EOM are normal. Pupils are equal, round, and reactive to light.  Neck: Normal range of motion. Neck supple.  Cardiovascular: Regular rhythm, S1 normal and S2 normal. Exam reveals no gallop and no friction rub.  No murmur heard. Pulmonary/Chest: Effort normal and breath sounds normal. No respiratory distress. He exhibits no tenderness.  Abdominal: Soft. Normal appearance and bowel sounds are normal. There is no hepatosplenomegaly. There is no tenderness. There is no rebound, no guarding, no tenderness at McBurney's point and negative Murphy's sign. No hernia.  Musculoskeletal: Normal range of motion.  Neurological: He is  alert and oriented to person, place, and time. He has normal strength. No cranial nerve deficit or sensory deficit. Coordination normal. GCS eye subscore is 4. GCS verbal subscore is 5. GCS motor subscore is 6.  Skin: Skin is warm, dry and intact. No rash noted. No cyanosis.  Psychiatric: He has a normal mood and affect. His speech is normal and behavior is normal. Thought content normal.  Nursing note and vitals reviewed.    ED Treatments / Results  Labs (all labs ordered are listed, but only abnormal results are displayed) Labs Reviewed  CBC WITH DIFFERENTIAL/PLATELET - Abnormal; Notable for the following components:      Result Value   RDW 15.9 (*)    All other components within normal limits  COMPREHENSIVE METABOLIC PANEL - Abnormal; Notable for the following components:   Potassium 5.5 (*)    Calcium 8.2 (*)    AST 45 (*)    Total Bilirubin 1.9 (*)    All other components within normal limits  BRAIN NATRIURETIC PEPTIDE  TROPONIN I    EKG  EKG Interpretation  Date/Time:  Tuesday June 01 2017 01:19:57 EST Ventricular Rate:  73 PR Interval:    QRS Duration: 97 QT Interval:  388 QTC Calculation: 428 R Axis:   72 Text Interpretation:  Sinus rhythm Biatrial enlargement RSR' in V1 or V2, right VCD or RVH Nonspecific T abnormalities, lateral leads Minimal ST elevation, anterior leads No significant change since last tracing Confirmed by Gilda CreasePollina, Christopher J [phone] on 06/01/2017 1:33:10 AM       Radiology Dg Chest 2 View  Result Date: 06/01/2017 CLINICAL DATA:  Chest pain and dyspnea for 48 hours. EXAM: CHEST  2 VIEW COMPARISON:  03/01/2017 FINDINGS: Stable left hemidiaphragm elevation. The lungs are clear. The pulmonary vasculature is normal. Hilar, mediastinal and cardiac contours are unremarkable and unchanged. No pleural effusion. IMPRESSION: No active cardiopulmonary disease. Electronically Signed   By: Ellery Plunkaniel R Mitchell M.D.   On: 06/01/2017 02:02     Procedures Procedures (including critical care time)  Medications Ordered in ED Medications - No data to display   Initial Impression / Assessment and Plan / ED Course  I have reviewed the triage vital signs and the nursing notes.  Pertinent labs & imaging results that were available during my care of the patient were reviewed by me and considered in my medical decision making (see chart for details).     Patient with history of coronary artery disease presents to the ER for evaluation of chest pain.  Patient was seen in the emergency department earlier today.  He was complaining of chest pain and shortness of breath, but apparently his symptoms were not present at time of arrival.  Patient was given prescriptions for his blood pressure medications  and discharged.  Since then he has been experiencing intermittent episodes of heaviness over the central chest with radiation to the jaw and arm.  Symptoms are nonexertional, but he does have severe dyspnea on exertion independent of the chest pain.  Reviewing his records reveals that he was admitted to [facility] in August with an acute coronary syndrome.  He had elevated troponins at that time, but not studied.  The note references outpatient adenosine stress test, but patient reports that this was never performed.  Patient experiencing intermittent episodes of mixed typical and atypical chest pain, requires further evaluation.  Final Clinical Impressions(s) / ED Diagnoses   Final diagnoses:  Chest pain, unspecified type    ED Discharge Orders    None       Pollina, Canary Brimhristopher J, MD 06/01/17 [phone]
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
## 2017-06-01 NOTE — ED Notes (Signed)
Echocardiogram at bedside.

## 2017-06-01 NOTE — ED Notes (Signed)
MD at bedside. 

## 2017-06-01 NOTE — ED Notes (Signed)
Patient is aware a urine sample is needed and has a urinal at bedside.  

## 2017-06-01 NOTE — ED Triage Notes (Signed)
Pt is c/o chest pain and back pain  Pt was brought in earlier by EMS with same complaints  Pt had a work up and was given prescriptions for his blood pressure medication and pain medication but has not had a chance to get them filled  Pt has been in lobby since he was discharged as he has no way home

## 2017-06-01 NOTE — ED Notes (Signed)
Patient requesting pain medicine for back pain. This RN explained that the patient has orders for tramadol and can have it again at 1300. Patient stated "it didn't do anything when I took it at 7am." This RN notified Dr. Hanley BenAlekh. Awaiting new orders for pain meds for patient.

## 2017-06-01 NOTE — Progress Notes (Addendum)
Patient ID: Raymond Valencia, male   DOB: [birth year], 46 y.o.   MRN: [id number] Patient was admitted early this morning for chest pain and and back pain.  Cardiology consult has been requested.  I have seen the patient and examined him at bedside and discussed the plan of care.  Repeat a.m. labs.  Will order PT eval for back pain.  Continue pain management.

## 2017-06-01 NOTE — Progress Notes (Signed)
  Echocardiogram 2D Echocardiogram has been performed.  Raymond Valencia, Raymond Valencia M 06/01/2017, 8:36 AM

## 2017-06-01 NOTE — Plan of Care (Signed)
  Progressing Health Behavior/Discharge Planning: Ability to manage health-related needs will improve 06/01/2017 2300 - Progressing by Cristela FeltSteffens, Lafawn Lenoir P, RN Clinical Measurements: Ability to maintain clinical measurements within normal limits will improve 06/01/2017 2300 - Progressing by Cristela FeltSteffens, Harmon Bommarito P, RN Will remain free from infection 06/01/2017 2300 - Progressing by Cristela FeltSteffens, Deniro Laymon P, RN Diagnostic test results will improve 06/01/2017 2300 - Progressing by Cristela FeltSteffens, Karilynn Carranza P, RN Respiratory complications will improve 06/01/2017 2300 - Progressing by Cristela FeltSteffens, Berl Bonfanti P, RN Cardiovascular complication will be avoided 06/01/2017 2300 - Progressing by Cristela FeltSteffens, Keith Cancio P, RN Activity: Risk for activity intolerance will decrease 06/01/2017 2300 - Progressing by Cristela FeltSteffens, Viliami Bracco P, RN Nutrition: Adequate nutrition will be maintained 06/01/2017 2300 - Progressing by Cristela FeltSteffens, Narely Nobles P, RN Coping: Level of anxiety will decrease 06/01/2017 2300 - Progressing by Cristela FeltSteffens, Natthew Marlatt P, RN Elimination: Will not experience complications related to urinary retention 06/01/2017 2300 - Progressing by Cristela FeltSteffens, Cornesha Radziewicz P, RN Pain Managment: General experience of comfort will improve 06/01/2017 2300 - Progressing by Cristela FeltSteffens, Charmeka Freeburg P, RN Safety: Ability to remain free from injury will improve 06/01/2017 2300 - Progressing by Cristela FeltSteffens, Roshan Roback P, RN Skin Integrity: Risk for impaired skin integrity will decrease 06/01/2017 2300 - Progressing by Cristela FeltSteffens, Mckaylie Vasey P, RN

## 2017-06-01 NOTE — ED Notes (Signed)
ED TO INPATIENT HANDOFF REPORT  Name/Age/Gender Raymond Valencia 46 y.o. male  Code Status    Code Status Orders  (From admission, onward)        Start     Ordered   06/01/17 0420  Full code  Continuous     06/01/17 0422    Code Status History    Date Active Date Inactive Code Status Order ID Comments User Context   This patient has a current code status but no historical code status.      Home/SNF/Other Home  Chief Complaint Chest Pain; Back Pain  Level of Care/Admitting Diagnosis ED Disposition    ED Disposition Condition Comment   Admit  Hospital Area: [facility] [[id number]]  Level of Care: Telemetry [5]  Admit to tele based on following criteria: Monitor for Ischemic changes  Diagnosis: Chest pain [400867]  Admitting Physician: Rise Patience [phone]  Attending Physician: Rise Patience [phone]  PT Class (Do Not Modify): Observation [104]  PT Acc Code (Do Not Modify): Observation [10022]       Medical History Past Medical History:  Diagnosis Date  . Chronic back pain   . Coronary artery disease   . Hypertension     Allergies No Known Allergies  IV Location/Drains/Wounds Patient Lines/Drains/Airways Status   Active Line/Drains/Airways    Name:   Placement date:   Placement time:   Site:   Days:   Peripheral IV 03/01/17 Right Antecubital   03/01/17    2309    Antecubital   92          Labs/Imaging Results for orders placed or performed during the hospital encounter of 06/01/17 (from the past 48 hour(s))  CBC with Differential/Platelet     Status: Abnormal   Collection Time: 06/01/17  1:08 AM  Result Value Ref Range   WBC 6.5 4.0 - 10.5 K/uL   RBC 5.29 4.22 - 5.81 MIL/uL   Hemoglobin 15.0 13.0 - 17.0 g/dL   HCT 43.6 39.0 - 52.0 %   MCV 82.4 78.0 - 100.0 fL   MCH 28.4 26.0 - 34.0 pg   MCHC 34.4 30.0 - 36.0 g/dL   RDW 15.9 (H) 11.5 - 15.5 %   Platelets 225 150 - 400 K/uL   Neutrophils Relative % 41 %   Neutro Abs 2.7 1.7 - 7.7 K/uL   Lymphocytes Relative 38 %   Lymphs Abs 2.5 0.7 - 4.0 K/uL   Monocytes Relative 13 %   Monocytes Absolute 0.9 0.1 - 1.0 K/uL   Eosinophils Relative 7 %   Eosinophils Absolute 0.4 0.0 - 0.7 K/uL   Basophils Relative 1 %   Basophils Absolute 0.0 0.0 - 0.1 K/uL  Comprehensive metabolic panel     Status: Abnormal   Collection Time: 06/01/17  1:08 AM  Result Value Ref Range   Sodium 139 135 - 145 mmol/L   Potassium 5.5 (H) 3.5 - 5.1 mmol/L    Comment: MODERATE HEMOLYSIS   Chloride 106 101 - 111 mmol/L   CO2 25 22 - 32 mmol/L   Glucose, Bld 93 65 - 99 mg/dL   BUN 15 6 - 20 mg/dL   Creatinine, Ser 0.89 0.61 - 1.24 mg/dL   Calcium 8.2 (L) 8.9 - 10.3 mg/dL   Total Protein 6.5 6.5 - 8.1 g/dL   Albumin 3.5 3.5 - 5.0 g/dL   AST 45 (H) 15 - 41 U/L   ALT 28 17 - 63 U/L   Alkaline  Phosphatase 67 38 - 126 U/L   Total Bilirubin 1.9 (H) 0.3 - 1.2 mg/dL   GFR calc non Af Amer >60 >60 mL/min   GFR calc Af Amer >60 >60 mL/min    Comment: (NOTE) The eGFR has been calculated using the CKD EPI equation. This calculation has not been validated in all clinical situations. eGFR's persistently <60 mL/min signify possible Chronic Kidney Disease.    Anion gap 8 5 - 15  Brain natriuretic peptide     Status: None   Collection Time: 06/01/17  1:08 AM  Result Value Ref Range   B Natriuretic Peptide 21.3 0.0 - 100.0 pg/mL  Troponin I     Status: None   Collection Time: 06/01/17  1:38 AM  Result Value Ref Range   Troponin I <0.03 <0.03 ng/mL  CBC     Status: Abnormal   Collection Time: 06/01/17  4:20 AM  Result Value Ref Range   WBC 5.5 4.0 - 10.5 K/uL   RBC 5.18 4.22 - 5.81 MIL/uL   Hemoglobin 14.3 13.0 - 17.0 g/dL   HCT 43.0 39.0 - 52.0 %   MCV 83.0 78.0 - 100.0 fL   MCH 27.6 26.0 - 34.0 pg   MCHC 33.3 30.0 - 36.0 g/dL   RDW 15.8 (H) 11.5 - 15.5 %   Platelets 200 150 - 400 K/uL  Creatinine, serum     Status: None   Collection Time: 06/01/17  4:20 AM  Result  Value Ref Range   Creatinine, Ser 0.81 0.61 - 1.24 mg/dL   GFR calc non Af Amer >60 >60 mL/min   GFR calc Af Amer >60 >60 mL/min    Comment: (NOTE) The eGFR has been calculated using the CKD EPI equation. This calculation has not been validated in all clinical situations. eGFR's persistently <60 mL/min signify possible Chronic Kidney Disease.   Urine rapid drug screen (hosp performed)     Status: Abnormal   Collection Time: 06/01/17  4:22 AM  Result Value Ref Range   Opiates POSITIVE (A) NONE DETECTED   Cocaine NONE DETECTED NONE DETECTED   Benzodiazepines NONE DETECTED NONE DETECTED   Amphetamines NONE DETECTED NONE DETECTED   Tetrahydrocannabinol NONE DETECTED NONE DETECTED   Barbiturates NONE DETECTED NONE DETECTED    Comment:        DRUG SCREEN FOR MEDICAL PURPOSES ONLY.  IF CONFIRMATION IS NEEDED FOR ANY PURPOSE, NOTIFY LAB WITHIN 5 DAYS.        LOWEST DETECTABLE LIMITS FOR URINE DRUG SCREEN Drug Class       Cutoff (ng/mL) Amphetamine      1000 Barbiturate      200 Benzodiazepine   921 Tricyclics       194 Opiates          300 Cocaine          300 THC              50    Dg Chest 2 View  Result Date: 06/01/2017 CLINICAL DATA:  Chest pain and dyspnea for 48 hours. EXAM: CHEST  2 VIEW COMPARISON:  03/01/2017 FINDINGS: Stable left hemidiaphragm elevation. The lungs are clear. The pulmonary vasculature is normal. Hilar, mediastinal and cardiac contours are unremarkable and unchanged. No pleural effusion. IMPRESSION: No active cardiopulmonary disease. Electronically Signed   By: Andreas Newport M.D.   On: 06/01/2017 02:02    Pending Labs Unresulted Labs (From admission, onward)   Start     Ordered   06/08/17  0500  Creatinine, serum  (enoxaparin (LOVENOX)    CrCl >/= 30 ml/min)  Weekly,   R    Comments:  while on enoxaparin therapy    06/01/17 0422   06/02/17 0500  CBC with Differential/Platelet  Tomorrow morning,   R     06/01/17 1104   06/02/17 0500   Comprehensive metabolic panel  Tomorrow morning,   R     06/01/17 1104   06/02/17 0500  Magnesium  Tomorrow morning,   R     06/01/17 1104   06/01/17 0420  HIV antibody (Routine Testing)  Once,   R     06/01/17 0422      Vitals/Pain Today's Vitals   06/01/17 1300 06/01/17 1315 06/01/17 1330 06/01/17 1400  BP: (!) 152/106 (!) 136/97 (!) 140/97   Pulse: 65 (!) 56 71   Resp: (!) 23 15 (!) 24   Temp:      TempSrc:      SpO2: 99% 99% 99%   Weight:      Height:      PainSc:    6     Isolation Precautions No active isolations  Medications Medications  enalapril (VASOTEC) tablet 20 mg (20 mg Oral Not Given 06/01/17 1330)  hydrALAZINE (APRESOLINE) tablet 50 mg (50 mg Oral Given 06/01/17 1046)  hydrochlorothiazide (HYDRODIURIL) tablet 25 mg (25 mg Oral Not Given 06/01/17 1046)  metoprolol tartrate (LOPRESSOR) tablet 50 mg (50 mg Oral Not Given 06/01/17 1331)  nitroGLYCERIN (NITROSTAT) SL tablet 0.4 mg (not administered)  acetaminophen (TYLENOL) tablet 650 mg (not administered)  ondansetron (ZOFRAN) injection 4 mg (not administered)  aspirin EC tablet 325 mg (325 mg Oral Given 06/01/17 1046)  traMADol (ULTRAM) tablet 100 mg (100 mg Oral Given 06/01/17 0658)  enoxaparin (LOVENOX) injection 40 mg (40 mg Subcutaneous Given 06/01/17 1044)  methocarbamol (ROBAXIN) tablet 500 mg (500 mg Oral Given 06/01/17 1238)  oxyCODONE (Oxy IR/ROXICODONE) immediate release tablet 5 mg (5 mg Oral Given 06/01/17 1238)  methocarbamol (ROBAXIN) tablet 500 mg (500 mg Oral Given 06/01/17 0658)    Mobility walks

## 2017-06-01 NOTE — ED Notes (Signed)
Assigned 1403 @ 14:15 call report @ 14:35

## 2017-06-01 NOTE — H&P (Signed)
History and Physical    Raymond Valencia ZOX:[id number] DOB: [birth year] DOA: 06/01/2017  PCP: Patient, No Pcp Per  Patient coming from: Home.  Chief Complaint: Chest pain.  HPI: Raymond Valencia is a 46 y.o. male with history of hypertension and chronic low back pain presents to the ER with complaint of chest pain.  Patient states that he has been having chest pain since yesterday which last for a few minutes and is not related to exertion and no associated shortness of breath.  Denies any radiation pain is mostly in the substernal.  This patient had come to the ER earlier yesterday for medication refills.  Patient has run out of his antihypertensive at that time.  ED Course: In the ER troponin chest x-ray and EKG were unremarkable.  Patient on my exam is chest pain-free.  Will be admitted for further management of chest pain.  Review of Systems: As per HPI, rest all negative.   Past Medical History:  Diagnosis Date  . Chronic back pain   . Coronary artery disease   . Hypertension     Past Surgical History:  Procedure Laterality Date  . CARDIAC SURGERY       reports that he has been smoking cigars.  he has never used smokeless tobacco. He reports that he does not drink alcohol or use drugs.  No Known Allergies  Family History  Problem Relation Age of Onset  . Hypertension Other     Prior to Admission medications   Medication Sig Start Date End Date Taking? Authorizing Provider  aspirin 81 MG chewable tablet Chew 81 mg by mouth daily.   Yes [provider]  carvedilol (COREG) 12.5 MG tablet Take 1 tablet (12.5 mg total) by mouth 2 (two) times daily with a meal. 05/31/17  Yes Messick, Noralyn PickPeter C, MD  enalapril (VASOTEC) 20 MG tablet Take 1 tablet (20 mg total) by mouth daily. 05/31/17  Yes Wynetta FinesMessick, Peter C, MD  hydrALAZINE (APRESOLINE) 50 MG tablet Take 1 tablet (50 mg total) by mouth 3 (three) times daily. Patient taking differently: Take 50 mg by mouth 2 (two)  times daily.  05/31/17  Yes Wynetta FinesMessick, Peter C, MD  hydrochlorothiazide (HYDRODIURIL) 25 MG tablet Take 1 tablet (25 mg total) by mouth daily. 05/31/17  Yes Wynetta FinesMessick, Peter C, MD  metoprolol tartrate (LOPRESSOR) 50 MG tablet Take 1 tablet (50 mg total) by mouth 2 (two) times daily. 05/31/17  Yes Wynetta FinesMessick, Peter C, MD  nitroGLYCERIN (NITROSTAT) 0.4 MG SL tablet Place 0.4 mg under the tongue every 5 (five) minutes as needed for chest pain.   Yes [provider]  traMADol (ULTRAM) 50 MG tablet Take 1 tablet (50 mg total) by mouth every 6 (six) hours as needed. Patient taking differently: Take 50 mg by mouth every 6 (six) hours as needed for moderate pain.  05/31/17  Yes Wynetta FinesMessick, Peter C, MD    Physical Exam: Vitals:   06/01/17 0047 06/01/17 0200 06/01/17 0300  BP: (!) 159/106 (!) 136/96 133/90  Pulse: 76 74 71  Resp: 16 20 18   Temp: 98.6 F (37 C)    TempSrc: Oral    SpO2: 96% 100% 98%  Weight: 109.8 kg (242 lb)    Height: 5\' 8"  (1.727 m)        Constitutional: Moderately built and nourished. Vitals:   06/01/17 0047 06/01/17 0200 06/01/17 0300  BP: (!) 159/106 (!) 136/96 133/90  Pulse: 76 74 71  Resp: 16 20 18   Temp:  98.6 F (37 C)    TempSrc: Oral    SpO2: 96% 100% 98%  Weight: 109.8 kg (242 lb)    Height: 5\' 8"  (1.727 m)     Eyes: Anicteric no pallor. ENMT: No discharge from the ears eyes nose or mouth. Neck: No JVD appreciated no mass felt. Respiratory: No rhonchi or crepitations. Cardiovascular: S1-S2 heard no murmurs appreciated. Abdomen: Soft nontender bowel sounds present. Musculoskeletal: No edema.  No joint effusion. Skin: No rash.  Skin appears warm. Neurologic: Alert awake oriented to time place and person.  Moves all extremities. Psychiatric: Appears normal.  Normal affect.   Labs on Admission: I have personally reviewed following labs and imaging studies  CBC: Recent Labs  Lab 06/01/17 0108  WBC 6.5  NEUTROABS 2.7  HGB 15.0  HCT 43.6  MCV 82.4   PLT 225   Basic Metabolic Panel: Recent Labs  Lab 06/01/17 0108  NA 139  K 5.5*  CL 106  CO2 25  GLUCOSE 93  BUN 15  CREATININE 0.89  CALCIUM 8.2*   GFR: Estimated Creatinine Clearance: 122 mL/min (by C-G formula based on SCr of 0.89 mg/dL). Liver Function Tests: Recent Labs  Lab 06/01/17 0108  AST 45*  ALT 28  ALKPHOS 67  BILITOT 1.9*  PROT 6.5  ALBUMIN 3.5   No results for input(s): LIPASE, AMYLASE in the last 168 hours. No results for input(s): AMMONIA in the last 168 hours. Coagulation Profile: No results for input(s): INR, PROTIME in the last 168 hours. Cardiac Enzymes: Recent Labs  Lab 06/01/17 0138  TROPONINI <0.03   BNP (last 3 results) No results for input(s): PROBNP in the last 8760 hours. HbA1C: No results for input(s): HGBA1C in the last 72 hours. CBG: No results for input(s): GLUCAP in the last 168 hours. Lipid Profile: No results for input(s): CHOL, HDL, LDLCALC, TRIG, CHOLHDL, LDLDIRECT in the last 72 hours. Thyroid Function Tests: No results for input(s): TSH, T4TOTAL, FREET4, T3FREE, THYROIDAB in the last 72 hours. Anemia Panel: No results for input(s): VITAMINB12, FOLATE, FERRITIN, TIBC, IRON, RETICCTPCT in the last 72 hours. Urine analysis: No results found for: COLORURINE, APPEARANCEUR, LABSPEC, PHURINE, GLUCOSEU, HGBUR, BILIRUBINUR, KETONESUR, PROTEINUR, UROBILINOGEN, NITRITE, LEUKOCYTESUR Sepsis Labs: @LABRCNTIP (procalcitonin:4,lacticidven:4) )No results found for this or any previous visit (from the past 240 hour(s)).   Radiological Exams on Admission: Dg Chest 2 View  Result Date: 06/01/2017 CLINICAL DATA:  Chest pain and dyspnea for 48 hours. EXAM: CHEST  2 VIEW COMPARISON:  03/01/2017 FINDINGS: Stable left hemidiaphragm elevation. The lungs are clear. The pulmonary vasculature is normal. Hilar, mediastinal and cardiac contours are unremarkable and unchanged. No pleural effusion. IMPRESSION: No active cardiopulmonary disease.  Electronically Signed   By: Ellery Plunk M.D.   On: 06/01/2017 02:02    EKG: Independently reviewed.  Normal sinus rhythm.  Assessment/Plan Active Problems:   Chest pain   Essential hypertension    1. Chest pain -patient states he has had a cardiac cath in 90s.  At this time patient is chest pain-free.  Will cycle cardiac markers check 2D echo aspirin as needed nitroglycerin.  I have requested cardiology consult. 2. Hypertension -I have restarted his home medications including lisinopril metoprolol hydralazine and enalapril.  Patient medication list shows he is also on Coreg but is already on metoprolol. 3. Chronic low back pain on tramadol.  I have ordered 1 dose of Robaxin due to increased pain.   DVT prophylaxis: Lovenox. Code Status: Full code. Family Communication: Discussed with patient.  Disposition Plan: Home. Consults called: Cardiology. Admission status: Observation.   Eduard ClosArshad N Latesa Fratto MD [facility] Pager [phone].  If 7PM-7AM, please contact night-coverage [email or web address] Password [email or web address]  06/01/2017, 4:22 AM

## 2017-06-02 ENCOUNTER — Ambulatory Visit (HOSPITAL_COMMUNITY): Admit: 2017-06-02 | Payer: Self-pay

## 2017-06-02 ENCOUNTER — Ambulatory Visit (HOSPITAL_COMMUNITY)
Admit: 2017-06-02 | Discharge: 2017-06-02 | Disposition: A | Discharge status: Home / Self Care | Payer: Self-pay | Attending: Cardiology | Admitting: Cardiology

## 2017-06-02 ENCOUNTER — Ambulatory Visit (HOSPITAL_COMMUNITY): Admit: 2017-06-02 | Payer: Medicaid Other

## 2017-06-02 DIAGNOSIS — G8929 Other chronic pain: Secondary | ICD-10-CM | POA: Diagnosis present

## 2017-06-02 DIAGNOSIS — R0789 Other chest pain: Secondary | ICD-10-CM

## 2017-06-02 DIAGNOSIS — M545 Low back pain: Secondary | ICD-10-CM

## 2017-06-02 DIAGNOSIS — M549 Dorsalgia, unspecified: Secondary | ICD-10-CM | POA: Diagnosis present

## 2017-06-02 DIAGNOSIS — I2510 Atherosclerotic heart disease of native coronary artery without angina pectoris: Secondary | ICD-10-CM | POA: Diagnosis present

## 2017-06-02 DIAGNOSIS — R079 Chest pain, unspecified: Secondary | ICD-10-CM

## 2017-06-02 DIAGNOSIS — I10 Essential (primary) hypertension: Secondary | ICD-10-CM

## 2017-06-02 LAB — COMPREHENSIVE METABOLIC PANEL
ALT: 17 U/L (ref 17–63)
AST: 17 U/L (ref 15–41)
Albumin: 3.2 g/dL — ABNORMAL LOW (ref 3.5–5.0)
Alkaline Phosphatase: 62 U/L (ref 38–126)
Anion gap: 5 (ref 5–15)
BUN: 17 mg/dL (ref 6–20)
CHLORIDE: 105 mmol/L (ref 101–111)
CO2: 29 mmol/L (ref 22–32)
Calcium: 8.2 mg/dL — ABNORMAL LOW (ref 8.9–10.3)
Creatinine, Ser: 0.9 mg/dL (ref 0.61–1.24)
Glucose, Bld: 98 mg/dL (ref 65–99)
POTASSIUM: 3.5 mmol/L (ref 3.5–5.1)
Sodium: 139 mmol/L (ref 135–145)
TOTAL PROTEIN: 5.8 g/dL — AB (ref 6.5–8.1)
Total Bilirubin: 0.8 mg/dL (ref 0.3–1.2)

## 2017-06-02 LAB — CBC WITH DIFFERENTIAL/PLATELET
Basophils Absolute: 0 10*3/uL (ref 0.0–0.1)
Basophils Relative: 1 %
Eosinophils Absolute: 0.3 10*3/uL (ref 0.0–0.7)
Eosinophils Relative: 6 %
HCT: 42.5 % (ref 39.0–52.0)
Hemoglobin: 13.6 g/dL (ref 13.0–17.0)
Lymphocytes Relative: 41 %
Lymphs Abs: 2.2 10*3/uL (ref 0.7–4.0)
MCH: 26.9 pg (ref 26.0–34.0)
MCHC: 32 g/dL (ref 30.0–36.0)
MCV: 84.2 fL (ref 78.0–100.0)
Monocytes Absolute: 0.6 10*3/uL (ref 0.1–1.0)
Monocytes Relative: 11 %
Neutro Abs: 2.2 10*3/uL (ref 1.7–7.7)
Neutrophils Relative %: 41 %
Platelets: 183 10*3/uL (ref 150–400)
RBC: 5.05 MIL/uL (ref 4.22–5.81)
RDW: 15.8 % — ABNORMAL HIGH (ref 11.5–15.5)
WBC: 5.3 10*3/uL (ref 4.0–10.5)

## 2017-06-02 LAB — HIV ANTIBODY (ROUTINE TESTING W REFLEX): HIV SCREEN 4TH GENERATION: NONREACTIVE

## 2017-06-02 LAB — MAGNESIUM: MAGNESIUM: 2.2 mg/dL (ref 1.7–2.4)

## 2017-06-02 MED ORDER — LISINOPRIL 20 MG PO TABS
40.0000 mg | ORAL_TABLET | Freq: Every day | ORAL | Status: DC
  Administered 2017-06-02 – 2017-06-03 (×2): 40 mg via ORAL
  Filled 2017-06-02 (×2): qty 2

## 2017-06-02 MED ORDER — HYDRALAZINE HCL 50 MG PO TABS
75.0000 mg | ORAL_TABLET | Freq: Two times a day (BID) | ORAL | Status: DC
  Administered 2017-06-02 – 2017-06-03 (×2): 75 mg via ORAL
  Filled 2017-06-02 (×2): qty 1

## 2017-06-02 MED ORDER — NITROGLYCERIN 0.4 MG SL SUBL
0.8000 mg | SUBLINGUAL_TABLET | Freq: Once | SUBLINGUAL | Status: AC
  Administered 2017-06-02: 0.8 mg via SUBLINGUAL

## 2017-06-02 MED ORDER — IOPAMIDOL (ISOVUE-370) INJECTION 76%
INTRAVENOUS | Status: AC
  Administered 2017-06-02: 100 mL
  Filled 2017-06-02: qty 100

## 2017-06-02 MED ORDER — CARVEDILOL 12.5 MG PO TABS
12.5000 mg | ORAL_TABLET | Freq: Two times a day (BID) | ORAL | Status: DC
  Administered 2017-06-02 – 2017-06-03 (×2): 12.5 mg via ORAL
  Filled 2017-06-02 (×3): qty 1

## 2017-06-02 MED ORDER — ACETAMINOPHEN 500 MG PO TABS
1000.0000 mg | ORAL_TABLET | Freq: Three times a day (TID) | ORAL | Status: DC
  Administered 2017-06-02 – 2017-06-03 (×3): 1000 mg via ORAL
  Filled 2017-06-02 (×3): qty 2

## 2017-06-02 MED ORDER — NITROGLYCERIN 0.4 MG SL SUBL
SUBLINGUAL_TABLET | SUBLINGUAL | Status: AC
  Administered 2017-06-02: 0.8 mg via SUBLINGUAL
  Filled 2017-06-02: qty 2

## 2017-06-02 NOTE — Significant Event (Signed)
Rapid Response Event Note  Overview: Time Called: 0847 Arrival Time: 0850 Event Type: Cardiac Notified By Beside RN Dahlia ClientHannah in regards to patient having chest pain. Patient was supposed to be going to [facility] to have a stress test completed as well via [facility]. When [facility] had arrived, patient was having chest pain 7/10. [facility] completed a EKG, no ST elevation seen or changes from previous EKG completed. [facility] notified MD Delton SeeNelson with Cardiology to cancel the stress test for now since patient was having chest pain. Upon entering the room, the patient and 2 doses of the sublingual nitro tablets 0.4mg . Also patient was given hydralazine 50 mg tablet for HTN.  Initial Focused Assessment:  Neuro: Alert and oriented x 4, purposeful movements in all extremities, pupils 2+ equal and reactive to light. Patient complaining of lower back pain 10/10, aching in presentation.  Cardiac: SB-NSR, HR 58-59, pulses palpable in 1-2+ in radial and pedal pulses bilaterally for extremities, S1-S2 heart sounds heard upon auscultation, Chest pain decreased from 7/10 to 3/10-0/10 per patient. Described pain as light pressure around left substernal area and left peck. BP 169/110 Pulmonary: Breath sounds clear in all lung fields, RR 12-14, O2 saturations 99-100% room air.  Interventions: Bedside RN Paged MD Nelson-awaiting for return page  [facility]   Plan of Care (if not transferred): Prepare patient for CT scan ordered. Would give PRN pain medication for Lower back pain. Also, would suggest order for morphine for chest pain. If patient chest pain returns and continues or becomes severe please call Rapid Response at [phone]. Continue to monitor and assess Vital Signs and patient for any changes.        Aarna Mihalko C

## 2017-06-02 NOTE — Progress Notes (Signed)
OT Cancellation Note  Patient Details Name: Raymond Valencia MRN: [id number] DOB: Nov 16, 1970   Cancelled Treatment:    Reason Eval/Treat Not Completed: Patient at procedure or test/ unavailable test/unavailable(nuclear stress test) Will check on pt next day Lise AuerLori Vita Currin, OT [phone]    Einar CrowEDDING, Dermot Gremillion D 06/02/2017, 11:44 AM

## 2017-06-02 NOTE — Progress Notes (Signed)
[facility]  [facility] scheduled.

## 2017-06-02 NOTE — Progress Notes (Signed)
PROGRESS NOTE    Raymond Valencia  NWG:[id number] DOB: 1970/10/15 DOA: 06/01/2017 PCP: Harlon DittyWofford, James, MD    Brief Narrative:  Patient is a 46 year old gentleman history of hypertension, coronary artery disease, chronic low back pain presented to the ED with complaints of chest pain.  Patient states chest pain not associated with shortness of breath or exertion however radiates to his jaw.  Patient also noted to have run out of his antihypertensive medications at that time.   Assessment & Plan:   Principal Problem:   Chest pain Active Problems:   Essential hypertension   Back pain   Chronic back pain   Coronary artery disease  #1 chest pain Patient presented with complaints of chest pain with history of hypertension coronary artery disease with both typical and atypical symptoms.  Cardiac enzymes cycled was negative.  BNP was 21.3.  Chest x-ray is unremarkable.  2D echo with a EF of 60-65%, no wall motion abnormalities, mild LVH.  Patient was to get a Myoview stress test done today however patient had complaints of chest pain as such that was canceled.  A coronary CT was ordered which is currently pending.  Patient's enalapril was changed to lisinopril 40 mg daily and hydralazine increased to 75 mg 3 times daily per cardiology.Per  Cardiology to avoid imdur as nitroglycerin gave patient headaches.  Cardiology following.  2.  HTN Patient's blood pressure was liable and elevated this morning.  Patient with complaints of back pain.  Patient noted to have run out of his medications prior to hospitalization.  Enalapril has been changed to lisinopril 40 mg daily per cardiology.  Imdur has been adjusted to 75 mg 3 times daily per cardiology.  Continue HCTZ.  Will change Lopressor to Coreg.  Cardiology following.  3.  Chronic back pain States had run out of his medications.  Patient currently receiving tramadol as needed as well as oxycodone as needed.  Outpatient follow-up.   DVT  prophylaxis: Lovenox Code Status: Full Family Communication: Updated patient.  No family at bedside. Disposition Plan: Likely Home once medically stable and per cardiology.   Consultants:   Cardiology: Dr. Delton SeeNelson 06/01/2017  Procedures:   Coronary CT 06/02/2017  2D echo 06/01/2017  Chest x-ray 06/01/2017  Antimicrobials:   None   Subjective: Patient noted to have chest pain this morning and given nitroglycerin resulting in headache.  Myoview stress test was subsequently canceled per cardiology and patient had a cardiac CT done with results pending.  Patient complaining of back pain states had run out of all his medication including getting to get his refills ordered per his PCP.  Objective: Vitals:   06/02/17 0200 06/02/17 0500 06/02/17 0854 06/02/17 1042  BP: (!) 149/86 (!) 150/91 (!) 160/101 (!) 155/115  Pulse: 63 (!) 59  (!) 58  Resp: 18 18  16   Temp: 98.2 F (36.8 C) 97.8 F (36.6 C)    TempSrc: Oral Oral    SpO2: 99% 100%  100%  Weight:      Height:        Intake/Output Summary (Last 24 hours) at 06/02/2017 1444 Last data filed at 06/02/2017 0600 Gross per 24 hour  Intake 360 ml  Output -  Net 360 ml   Filed Weights   06/01/17 0047 06/01/17 1532  Weight: 109.8 kg (242 lb) 109.8 kg (241 lb 16 oz)    Examination:  General exam: Appears calm and comfortable  Respiratory system: Clear to auscultation. Respiratory effort normal. Cardiovascular system: S1 &  S2 heard, RRR. No JVD, murmurs, rubs, gallops or clicks. No pedal edema. Gastrointestinal system: Abdomen is nondistended, soft and nontender. No organomegaly or masses felt. Normal bowel sounds heard. Central nervous system: Alert and oriented. No focal neurological deficits. Extremities: Symmetric 5 x 5 power. Skin: No rashes, lesions or ulcers Psychiatry: Judgement and insight appear normal. Mood & affect appropriate.     Data Reviewed: I have personally reviewed following labs and imaging  studies  CBC: Recent Labs  Lab 06/01/17 0108 06/01/17 0420 06/02/17 0403  WBC 6.5 5.5 5.3  NEUTROABS 2.7  --  2.2  HGB 15.0 14.3 13.6  HCT 43.6 43.0 42.5  MCV 82.4 83.0 84.2  PLT 225 200 183   Basic Metabolic Panel: Recent Labs  Lab 06/01/17 0108 06/01/17 0420 06/02/17 0403  NA 139  --  139  K 5.5*  --  3.5  CL 106  --  105  CO2 25  --  29  GLUCOSE 93  --  98  BUN 15  --  17  CREATININE 0.89 0.81 0.90  CALCIUM 8.2*  --  8.2*  MG  --   --  2.2   GFR: Estimated Creatinine Clearance: 120.7 mL/min (by C-G formula based on SCr of 0.9 mg/dL). Liver Function Tests: Recent Labs  Lab 06/01/17 0108 06/02/17 0403  AST 45* 17  ALT 28 17  ALKPHOS 67 62  BILITOT 1.9* 0.8  PROT 6.5 5.8*  ALBUMIN 3.5 3.2*   No results for input(s): LIPASE, AMYLASE in the last 168 hours. No results for input(s): AMMONIA in the last 168 hours. Coagulation Profile: No results for input(s): INR, PROTIME in the last 168 hours. Cardiac Enzymes: Recent Labs  Lab 06/01/17 0138  TROPONINI <0.03   BNP (last 3 results) No results for input(s): PROBNP in the last 8760 hours. HbA1C: No results for input(s): HGBA1C in the last 72 hours. CBG: No results for input(s): GLUCAP in the last 168 hours. Lipid Profile: No results for input(s): CHOL, HDL, LDLCALC, TRIG, CHOLHDL, LDLDIRECT in the last 72 hours. Thyroid Function Tests: No results for input(s): TSH, T4TOTAL, FREET4, T3FREE, THYROIDAB in the last 72 hours. Anemia Panel: No results for input(s): VITAMINB12, FOLATE, FERRITIN, TIBC, IRON, RETICCTPCT in the last 72 hours. Sepsis Labs: No results for input(s): PROCALCITON, LATICACIDVEN in the last 168 hours.  No results found for this or any previous visit (from the past 240 hour(s)).       Radiology Studies: Dg Chest 2 View  Result Date: 06/01/2017 CLINICAL DATA:  Chest pain and dyspnea for 48 hours. EXAM: CHEST  2 VIEW COMPARISON:  03/01/2017 FINDINGS: Stable left hemidiaphragm  elevation. The lungs are clear. The pulmonary vasculature is normal. Hilar, mediastinal and cardiac contours are unremarkable and unchanged. No pleural effusion. IMPRESSION: No active cardiopulmonary disease. Electronically Signed   By: Ellery Plunk M.D.   On: 06/01/2017 02:02   Ct Coronary Morph W/cta Cor W/score W/ca W/cm &/or Wo/cm  Result Date: 06/02/2017 EXAM: OVER-READ INTERPRETATION  CT CHEST The following report is an over-read performed by radiologist Dr. Noe Gens [facility] on 06/02/2017. This over-read does not include interpretation of cardiac or coronary anatomy or pathology. The coronary CTA interpretation by the cardiologist is attached. COMPARISON:  None. FINDINGS: Vascular: Heart is borderline in size.  Aorta is normal caliber. Mediastinum/Nodes: No adenopathy in the lower mediastinum or hila. Lungs/Pleura: Minimal dependent and bibasilar atelectasis. No effusions. Upper Abdomen: Imaging into the upper abdomen shows no acute findings.  Musculoskeletal: Chest wall soft tissues are unremarkable. No acute bony abnormality. IMPRESSION: No acute or significant extracardiac abnormality. Electronically Signed   By: Charlett NoseKevin  Dover M.D.   On: 06/02/2017 11:44        Scheduled Meds: . acetaminophen  1,000 mg Oral TID  . aspirin EC  325 mg Oral Daily  . enoxaparin (LOVENOX) injection  40 mg Subcutaneous Q24H  . hydrALAZINE  75 mg Oral BID  . hydrochlorothiazide  25 mg Oral Daily  . lisinopril  40 mg Oral Daily  . metoprolol tartrate  50 mg Oral BID  . nicotine  21 mg Transdermal Daily   Continuous Infusions:   LOS: 0 days    Time spent: 35 mins    Ramiro Harvestaniel Thompson, MD [facility] Pager [phone]  If 7PM-7AM, please contact night-coverage [email or web address] Password [email or web address] 06/02/2017, 2:44 PM

## 2017-06-02 NOTE — Progress Notes (Signed)
Patient tolerated CT well stated slight headache 2/10 throbbing Cardiologist ordered patient to have coffee of coke. Patient tolerated drinking coffee. [facility]

## 2017-06-02 NOTE — Progress Notes (Signed)
PT Cancellation Note  Patient Details Name: Raymond Valencia MRN: [id number] DOB: 26-Nov-1970   Cancelled Treatment:    Reason Eval/Treat Not Completed: Patient at procedure or test/unavailable(nuclear stress test)   Vylette Strubel,KATHrine E 06/02/2017, 10:32 AM Zenovia JarredKati Pricella Gaugh, PT, DPT 06/02/2017 Pager: [phone]

## 2017-06-02 NOTE — Progress Notes (Signed)
Progress Note  Patient Name: Raymond Valencia Date of Encounter: 06/02/2017  Primary Cardiologist: New - Dr. Delton SeeNelson  Subjective   Had CP this AM relieved with 2 SL NTG. States he's had chest pain all his life. He is upset that the pain medication he is being given is not helping his back pain.  Inpatient Medications    Scheduled Meds: . aspirin EC  325 mg Oral Daily  . enalapril  20 mg Oral Daily  . enoxaparin (LOVENOX) injection  40 mg Subcutaneous Q24H  . hydrALAZINE  50 mg Oral BID  . hydrochlorothiazide  25 mg Oral Daily  . metoprolol tartrate  50 mg Oral BID  . nicotine  21 mg Transdermal Daily   Continuous Infusions:  PRN Meds: acetaminophen, methocarbamol, nitroGLYCERIN, ondansetron (ZOFRAN) IV, oxyCODONE, traMADol   Vital Signs    Vitals:   06/01/17 2100 06/02/17 0200 06/02/17 0500 06/02/17 0854  BP: (!) 159/88 (!) 149/86 (!) 150/91 (!) 160/101  Pulse: 66 63 (!) 59   Resp: 18 18 18    Temp: 98 F (36.7 C) 98.2 F (36.8 C) 97.8 F (36.6 C)   TempSrc: Oral Oral Oral   SpO2: 100% 99% 100%   Weight:      Height:        Intake/Output Summary (Last 24 hours) at 06/02/2017 0929 Last data filed at 06/02/2017 0600 Gross per 24 hour  Intake 360 ml  Output -  Net 360 ml   Filed Weights   06/01/17 0047 06/01/17 1532  Weight: 242 lb (109.8 kg) 241 lb 16 oz (109.8 kg)    Telemetry    NSR, SB upper 50s-70s (on metoprolol) - Personally Reviewed  Physical Exam   GEN: No acute distress.  HEENT: Normocephalic, atraumatic, sclera mildly icteric. Neck: No JVD or bruits. Cardiac: RRR no murmurs, rubs, or gallops.  Radials/DP/PT 1+ and equal bilaterally.  Respiratory: Clear to auscultation bilaterally. Breathing is unlabored. GI: Soft, nontender, non-distended, BS +x 4. MS: no deformity. Extremities: No clubbing or cyanosis. No edema. Distal pedal pulses are 2+ and equal bilaterally. Neuro:  AAOx3. Follows commands. Psych:  Responds to questions  appropriately with a normal affect.  Labs    Chemistry Recent Labs  Lab 06/01/17 0108 06/01/17 0420 06/02/17 0403  NA 139  --  139  K 5.5*  --  3.5  CL 106  --  105  CO2 25  --  29  GLUCOSE 93  --  98  BUN 15  --  17  CREATININE 0.89 0.81 0.90  CALCIUM 8.2*  --  8.2*  PROT 6.5  --  5.8*  ALBUMIN 3.5  --  3.2*  AST 45*  --  17  ALT 28  --  17  ALKPHOS 67  --  62  BILITOT 1.9*  --  0.8  GFRNONAA >60 >60 >60  GFRAA >60 >60 >60  ANIONGAP 8  --  5     Hematology Recent Labs  Lab 06/01/17 0108 06/01/17 0420 06/02/17 0403  WBC 6.5 5.5 5.3  RBC 5.29 5.18 5.05  HGB 15.0 14.3 13.6  HCT 43.6 43.0 42.5  MCV 82.4 83.0 84.2  MCH 28.4 27.6 26.9  MCHC 34.4 33.3 32.0  RDW 15.9* 15.8* 15.8*  PLT 225 200 183    Cardiac Enzymes Recent Labs  Lab 06/01/17 0138  TROPONINI <0.03   No results for input(s): TROPIPOC in the last 168 hours.   BNP Recent Labs  Lab 06/01/17 0108  BNP  21.3     DDimer No results for input(s): DDIMER in the last 168 hours.   Radiology    Dg Chest 2 View  Result Date: 06/01/2017 CLINICAL DATA:  Chest pain and dyspnea for 48 hours. EXAM: CHEST  2 VIEW COMPARISON:  03/01/2017 FINDINGS: Stable left hemidiaphragm elevation. The lungs are clear. The pulmonary vasculature is normal. Hilar, mediastinal and cardiac contours are unremarkable and unchanged. No pleural effusion. IMPRESSION: No active cardiopulmonary disease. Electronically Signed   By: Ellery Plunkaniel R Mitchell M.D.   On: 06/01/2017 02:02    Cardiac Studies   2D Echo 06/01/17 Study Conclusions - Left ventricle: The cavity size was normal. Wall thickness was  increased in a pattern of mild LVH. Systolic function was normal.   The estimated ejection fraction was in the range of 60% to 65%. Wall motion was normal; there were no regional wall motion abnormalities. Left ventricular diastolic function parameters   were normal. - Mitral valve: Calcified annulus. Impressions: - Normal LV  systolic and diastolic function; mild LVH.  Patient Profile     46 y.o. 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 [facility] for HTN urgency.  Assessment & Plan    1. Chest pain - neg troponin this admission, 2D echo unrevealing. D/w Dr. Delton SeeNelson - plan for cardiac CTA today. Pt agreeable. Dr. Delton SeeNelson states she wrote orders. I called nuc med to make them aware. Nursing staff has also been contacted by CT to arrange transfer. Will also add lipid panel for AM if patient is still inpatient.  2. Essential HTN - labile since admission. BP remains elevated this AM but he also continues to report ongoing back pain which may be contributing. Given that hydralazine is traditionally a TID medicine, consider changing this to amlodipine instead for better compliance. Not clear why carvedilol was switched to metoprolol; primary team will need to make sure patient aware of finalized med list at discharge.  3. Chronic back pain - further management per primary team.  For questions or updates, please contact [facility] Please consult [email or web address] for contact info under Cardiology/STEMI.  Signed, Laurann Montanaayna N Dunn, PA-C 06/02/2017, 9:29 AM    The patient was seen, examined and discussed with Ronie Spiesayna Dunn, PA-C and I agree with the above.   46 y.o. male with a hx of HTN and chronic low back pain who is being seen today for the evaluation of chest pain, back pain and a constant headache. He has been having intermittent substernal chest pain for "a couple of days". It feels like pressure, the patient doesn't describe it but when asked questions he has all the symptoms - yes to radiating to his jaw and back, also DOE after walking a block for about a year. The pain is at rest, while in bed. No palpitations or syncope, no LE  edema, orthopnea, PND.  He is trying to get disability blaming his back pain. He is not working. He doesn't have a  Mr. Cyndia DiverMcFadden does not work. He has no insurance, he was not taking his medications, h/o cocaine abuse.  Physical exam shows no JVD, S1,2, no murmur or rub, clear lungs, no LE edema and good pulses.  ECG shows SR, no significant ST T wave abnormalities, unchanged from prior. Troponin is negative x 1. BP is elevated.  Echocardiogram shows -  Normal LV systolic and diastolic function; mild LVH. He had another episode of chest pain this am relieved by sl NTG x 2, no ECG changes. Nuclear stress test was switched to coronary CTA. I will change his enalapril to lisinopril 40 mg po daily and increase hydralazine to 75 mg po TID< avoid imdur as NTG gets him headaches.   Tobias Alexander, MD 06/02/2017

## 2017-06-03 ENCOUNTER — Encounter (HOSPITAL_COMMUNITY): Payer: Self-pay | Admitting: Internal Medicine

## 2017-06-03 DIAGNOSIS — R072 Precordial pain: Secondary | ICD-10-CM

## 2017-06-03 DIAGNOSIS — E785 Hyperlipidemia, unspecified: Secondary | ICD-10-CM

## 2017-06-03 DIAGNOSIS — E7800 Pure hypercholesterolemia, unspecified: Secondary | ICD-10-CM | POA: Diagnosis present

## 2017-06-03 HISTORY — DX: Hyperlipidemia, unspecified: E78.5

## 2017-06-03 LAB — BASIC METABOLIC PANEL
ANION GAP: 7 (ref 5–15)
BUN: 10 mg/dL (ref 6–20)
CHLORIDE: 98 mmol/L — AB (ref 101–111)
CO2: 31 mmol/L (ref 22–32)
Calcium: 8.5 mg/dL — ABNORMAL LOW (ref 8.9–10.3)
Creatinine, Ser: 0.91 mg/dL (ref 0.61–1.24)
GFR calc Af Amer: >60 mL/min (ref >60)
GLUCOSE: 94 mg/dL (ref 65–99)
POTASSIUM: 3.5 mmol/L (ref 3.5–5.1)
Sodium: 136 mmol/L (ref 135–145)

## 2017-06-03 LAB — LIPID PANEL
CHOL/HDL RATIO: 4.6 ratio
CHOLESTEROL: 190 mg/dL (ref 0–200)
HDL: 41 mg/dL (ref >40)
LDL Cholesterol: 130 mg/dL — ABNORMAL HIGH (ref 0–99)
Triglycerides: 96 mg/dL (ref <150)
VLDL: 19 mg/dL (ref 0–40)

## 2017-06-03 LAB — CBC
HEMATOCRIT: 45 % (ref 39.0–52.0)
HEMOGLOBIN: 15 g/dL (ref 13.0–17.0)
MCH: 27.8 pg (ref 26.0–34.0)
MCHC: 33.3 g/dL (ref 30.0–36.0)
MCV: 83.3 fL (ref 78.0–100.0)
PLATELETS: 204 10*3/uL (ref 150–400)
RBC: 5.4 MIL/uL (ref 4.22–5.81)
RDW: 15.4 % (ref 11.5–15.5)
WBC: 5.5 10*3/uL (ref 4.0–10.5)

## 2017-06-03 MED ORDER — PANTOPRAZOLE SODIUM 40 MG PO TBEC: 40.0000 mg | DELAYED_RELEASE_TABLET | Freq: Every day | ORAL | 0 refills | Status: AC

## 2017-06-03 MED ORDER — HYDROCHLOROTHIAZIDE 25 MG PO TABS: 25.0000 mg | ORAL_TABLET | Freq: Every day | ORAL | 0 refills | Status: AC

## 2017-06-03 MED ORDER — POTASSIUM CHLORIDE CRYS ER 20 MEQ PO TBCR
40.0000 meq | EXTENDED_RELEASE_TABLET | Freq: Once | ORAL | Status: AC
Start: 2017-06-03 — End: 2017-06-03
  Administered 2017-06-03: 40 meq via ORAL
  Filled 2017-06-03: qty 2

## 2017-06-03 MED ORDER — LISINOPRIL 40 MG PO TABS: 40.0000 mg | ORAL_TABLET | Freq: Every day | ORAL | 0 refills | Status: AC

## 2017-06-03 MED ORDER — HYDRALAZINE HCL 25 MG PO TABS: 75.0000 mg | ORAL_TABLET | Freq: Three times a day (TID) | ORAL | 0 refills | Status: AC

## 2017-06-03 MED ORDER — ATORVASTATIN CALCIUM 40 MG PO TABS: 40.0000 mg | ORAL_TABLET | Freq: Every day | ORAL | 0 refills | Status: AC

## 2017-06-03 MED ORDER — PANTOPRAZOLE SODIUM 40 MG PO TBEC
40.0000 mg | DELAYED_RELEASE_TABLET | Freq: Every day | ORAL | Status: DC
  Administered 2017-06-03: 40 mg via ORAL
  Filled 2017-06-03: qty 1

## 2017-06-03 MED ORDER — NICOTINE 21 MG/24HR TD PT24: 21.0000 mg | MEDICATED_PATCH | Freq: Every day | TRANSDERMAL | 0 refills | Status: AC

## 2017-06-03 MED ORDER — HYDRALAZINE HCL 50 MG PO TABS
75.0000 mg | ORAL_TABLET | Freq: Three times a day (TID) | ORAL | Status: DC
  Filled 2017-06-03: qty 1

## 2017-06-03 MED ORDER — CARVEDILOL 12.5 MG PO TABS: 12.5000 mg | ORAL_TABLET | Freq: Two times a day (BID) | ORAL | 0 refills | Status: AC

## 2017-06-03 MED FILL — PANTOPRAZOLE SOD DR 40 MG T: 40 | 30 days supply | Qty: 30 | Fill #0

## 2017-06-03 MED FILL — hydrALAZINE HCL 25 MG TABS: 25 | 30 days supply | Qty: 270 | Fill #0

## 2017-06-03 MED FILL — traMADol HCL 50 MG TABS: 50 | 30 days supply | Qty: 240 | Fill #0

## 2017-06-03 MED FILL — HYDROCHLOROTHIAZIDE 25 MG T: 25 | 30 days supply | Qty: 30 | Fill #0

## 2017-06-03 MED FILL — CARVEDILOL 12.5 MG TABS: 12.5 | 30 days supply | Qty: 60 | Fill #0

## 2017-06-03 MED FILL — ATORVASTATIN 40 MG TABLET: 40 | 30 days supply | Qty: 30 | Fill #0

## 2017-06-03 MED FILL — LISINOPRIL 40 MG TABLET: 40 | 30 days supply | Qty: 30 | Fill #0

## 2017-06-03 NOTE — Discharge Summary (Addendum)
Physician Discharge Summary  Raymond Valencia ZOX:[id number] DOB: 1971-02-14 DOA: 06/01/2017  PCP: Harlon DittyWofford, James, MD  Admit date: 06/01/2017 Discharge date: 06/03/2017  Time spent: 65 minutes  Recommendations for Outpatient Follow-up:  1. Follow-up with Harlon DittyWofford, James, MD/Dr. Chelsea AusSachs in 1-2 weeks.  On follow-up patient's blood pressure need to be reassessed as patient's antihypertensive medications were adjusted per cardiology.  Patient has also been started on a statin for elevated LDL of 130. Patient will need a basic metabolic profile done on follow-up to follow-up on electrolytes and renal function. 2. Follow up with Dr. Delton SeeNelson cardiology, 08/30/2016.   Discharge Diagnoses:  Principal Problem:   Chest pain Active Problems:   Essential hypertension   Back pain   Chronic back pain   Coronary artery disease   Elevated LDL cholesterol level   Discharge Condition: Stable and improved  Diet recommendation: Healthy  Filed Weights   06/01/17 0047 06/01/17 1532  Weight: 109.8 kg (242 lb) 109.8 kg (241 lb 16 oz)    History of present illness:  Per Dr.Kakrakandy  Raymond Rightbraham B Emberton is a 46 y.o. male with history of hypertension and chronic low back pain presented to the ER with complaint of chest pain.  Patient stated that he had been having chest pain since the day prior to admission, which lasted for a few minutes and was not related to exertion and no associated shortness of breath.  Denied any radiation pain was mostly in the substernal region.  The patient had come to the ER earlier 1 day prior to admission, for medication refills.  Patient had run out of his antihypertensive at that time.  ED Course: In the ER troponin chest x-ray and EKG were unremarkable.  Patient on exam was chest pain-free.  Will be admitted for further management of chest pain.     Hospital Course:  #1 chest pain Patient presented with complaints of chest pain with history of hypertension coronary  artery disease with both typical and atypical symptoms.  Cardiac enzymes cycled were negative.  BNP was 21.3.  Chest x-ray was unremarkable.  2D echo with a EF of 60-65%, no wall motion abnormalities, mild LVH.  Patient was to get a Myoview stress test done 06/02/2017, however patient had complaints of chest pain as such IV stress test was canceled per cardiology.   A coronary CT was ordered which per cardiology showed minimal coronary artery disease with a low calcium score of 2, no obstructive disease.  Fasting lipid panel done had a LDL of 130 and patient will be started on a statin on discharge in addition to a baby aspirin.  Patient was seen by cardiology during the hospitalization and patient's antihypertensive medications were adjusted. Patient's enalapril was changed to lisinopril 40 mg daily and hydralazine increased to 75 mg 3 times daily per cardiology. Patient was continued on his HCTZ as well as home regimen of Coreg.  Per  Cardiology to avoid imdur as nitroglycerin gave patient headaches.  Patient will follow-up with cardiology 3 months post discharge.  Patient will be discharged in stable and improved condition. Patient was started on a PPI.  On outpatient follow-up further evaluation for patient's chest pain will need to be assessed by PCP.  May consider outpatient GI evaluation.  2.  HTN Patient's blood pressure was liable and elevated during the hospitalization.  Patient also noted with complaints of back pain.  It was felt patient's complaints of back pain may have also contributed to his elevated blood pressure.  Patient noted to have run out of his medications prior to hospitalization.  Patient was seen in consultation by cardiology secondary to complaints of chest pain. Enalapril was changed to lisinopril 40 mg daily per cardiology. Patient's hydralazine was adjusted to 75 mg 3 times daily per cardiology.  She was maintained on home regimen of HCTZ as well as Lopressor initially.   Lopressor was subsequently discontinued and the patient was placed back on home regimen of Coreg.  Patient will follow-up with PCP in the outpatient setting for further management of his antihypertensive agents.   3.  Chronic back pain States had run out of his medications.  Patient currently receiving tramadol as needed as well as oxycodone as needed.    Patient will be discharged to follow-up with his PCP.  Patient was able to get in touch with his PCPs office for refills on his tramadol.  Outpatient follow-up.  #4 elevated LDL Patient was noted on fasting lipid panel to have a LDL of 130.  It was recommended per cardiology that patient be started on Lipitor 40 mg daily with outpatient follow-up.     Procedures:  Coronary CT 06/02/2017  2D echo 06/01/2017  Chest x-ray 06/01/2017      Consultations:  Cardiology: Dr. Delton SeeNelson 06/01/2017      Discharge Exam: Vitals:   06/03/17 0544 06/03/17 0805  BP: 128/73 130/68  Pulse: (!) 52 62  Resp: 18   Temp: 97.6 F (36.4 C)   SpO2: 98%     General: NAD Cardiovascular: RRR Respiratory: CTAB  Discharge Instructions   Discharge Instructions    Diet - low sodium heart healthy   Complete by:  As directed    Increase activity slowly   Complete by:  As directed      Allergies as of 06/03/2017   No Known Allergies     Medication List    STOP taking these medications   enalapril 20 MG tablet Commonly known as:  VASOTEC   metoprolol tartrate 50 MG tablet Commonly known as:  LOPRESSOR     TAKE these medications   aspirin 81 MG chewable tablet Chew 81 mg by mouth daily.   atorvastatin 40 MG tablet Commonly known as:  LIPITOR Take 1 tablet (40 mg total) by mouth daily.   carvedilol 12.5 MG tablet Commonly known as:  COREG Take 1 tablet (12.5 mg total) by mouth 2 (two) times daily with a meal.   hydrALAZINE 25 MG tablet Commonly known as:  APRESOLINE Take 3 tablets (75 mg total) by mouth every 8 (eight)  hours. What changed:    medication strength  how much to take  when to take this   hydrochlorothiazide 25 MG tablet Commonly known as:  HYDRODIURIL Take 1 tablet (25 mg total) by mouth daily.   lisinopril 40 MG tablet Commonly known as:  PRINIVIL,ZESTRIL Take 1 tablet (40 mg total) by mouth daily. Start taking on:  06/04/2017   nicotine 21 mg/24hr patch Commonly known as:  NICODERM CQ - dosed in mg/24 hours Place 1 patch (21 mg total) onto the skin daily. Start taking on:  06/04/2017   nitroGLYCERIN 0.4 MG SL tablet Commonly known as:  NITROSTAT Place 0.4 mg under the tongue every 5 (five) minutes as needed for chest pain.   pantoprazole 40 MG tablet Commonly known as:  PROTONIX Take 1 tablet (40 mg total) by mouth daily at 6 (six) AM. Start taking on:  06/04/2017   traMADol 50 MG tablet Commonly known as:  ULTRAM Take 1 tablet (50 mg total) by mouth every 6 (six) hours as needed. What changed:  reasons to take this      No Known Allergies Follow-up Information    Harlon Ditty, MD. Schedule an appointment as soon as possible for a visit in 1 week(s).   Specialty:  Internal Medicine Why:  Please follow up with your PCP who also appears to help you with your medicaitons in the past. F/U in 1-2 weeks. Contact information: [address] [place] [address] [phone]        Lars Masson, MD Follow up.   Specialty:  Cardiology Why:  See appointment listed - 08/30/16 at 9am. Arrive 15 minutes prior to appointment time to allow time to park and register. Contact information: [address] [place] [address] [phone]        Nils Flack, MD. Schedule an appointment as soon as possible for a visit in 1 week(s).   Why:  F/U IN 1-2 WEEKS Contact information: [address] [place] [address] [phone]            The results of significant diagnostics from this hospitalization  (including imaging, microbiology, ancillary and laboratory) are listed below for reference.    Significant Diagnostic Studies: Dg Chest 2 View  Result Date: 06/01/2017 CLINICAL DATA:  Chest pain and dyspnea for 48 hours. EXAM: CHEST  2 VIEW COMPARISON:  03/01/2017 FINDINGS: Stable left hemidiaphragm elevation. The lungs are clear. The pulmonary vasculature is normal. Hilar, mediastinal and cardiac contours are unremarkable and unchanged. No pleural effusion. IMPRESSION: No active cardiopulmonary disease. Electronically Signed   By: Ellery Plunk M.D.   On: 06/01/2017 02:02   Ct Coronary Morph W/cta Cor W/score W/ca W/cm &/or Wo/cm  Addendum Date: 06/03/2017   ADDENDUM REPORT: 06/03/2017 09:10 CLINICAL DATA:  60 -year-old male with atypical chest pain. EXAM: Cardiac/Coronary  CT TECHNIQUE: The patient was scanned on a [employer]. FINDINGS: A 120 kV prospective scan was triggered in the descending thoracic aorta at 111 HU's. Axial non-contrast 3 mm slices were carried out through the heart. The data set was analyzed on a dedicated work station and scored using the Agatson method. Gantry rotation speed was 250 msecs and collimation was .6 mm. No beta blockade and 0.8 mg of sl NTG was given. The 3D data set was reconstructed in 5% intervals of the 67-82 % of the R-R cycle. Diastolic phases were analyzed on a dedicated work station using MPR, MIP and VRT modes. The patient received 80 cc of contrast. Aorta:  Normal size.  No calcifications.  No dissection. Aortic Valve:  Trileaflet.  No calcifications. Coronary Arteries:  Normal coronary origin.  Right dominance. RCA is a large dominant artery that gives rise to PDA and a small PLA. There is no plaque. Left main is a large artery that gives rise to LAD, ramus intermedius and LCX arteries. LAD is a large vessel that gives rise to two small diagonal arteries and has no plaque. RI is a small artery that has no plaque. LCX is a non-dominant artery  that gives rise to one large OM1 branch. There is minimal calcified plaque in the proximal portion with associated stenosis 0-25%. Other findings: Normal pulmonary vein drainage into the left atrium. Normal let atrial appendage without a thrombus. 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 IMPRESSION: 1. Coronary calcium score of 2. This was 93 percentile for age and sex matched control. 2.  Normal coronary origin with right dominance. 3. Minimal non-obstructive CAD. 4. 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 Electronically Signed   By: Tobias Alexander   On: 06/03/2017 09:10   Result Date: 06/03/2017 EXAM: OVER-READ INTERPRETATION  CT CHEST The following report is an over-read performed by radiologist Dr. Noe Gens [facility] on 06/02/2017. This over-read does not include interpretation of cardiac or coronary anatomy or pathology. The coronary CTA interpretation by the cardiologist is attached. COMPARISON:  None. FINDINGS: Vascular: Heart is borderline in size.  Aorta is normal caliber. Mediastinum/Nodes: No adenopathy in the lower mediastinum or hila. Lungs/Pleura: Minimal dependent and bibasilar atelectasis. No effusions. Upper Abdomen: Imaging into the upper abdomen shows no acute findings. Musculoskeletal: Chest wall soft tissues are unremarkable. No acute bony abnormality. IMPRESSION: No acute or significant extracardiac abnormality. Electronically Signed: By: Charlett Nose M.D. On: 06/02/2017 11:44    Microbiology: No results found for this or any previous visit (from the past 240 hour(s)).   Labs: Basic Metabolic Panel: Recent Labs  Lab 06/01/17 0108 06/01/17 0420 06/02/17 0403 06/03/17 0518  NA 139  --  139 136  K 5.5*  --  3.5 3.5  CL 106  --  105 98*  CO2 25  --  29 31  GLUCOSE 93  --  98 94  BUN 15  --  17 10  CREATININE 0.89 0.81 0.90 0.91  CALCIUM 8.2*  --  8.2* 8.5*  MG  --   --  2.2  --    Liver Function Tests: Recent Labs  Lab  06/01/17 0108 06/02/17 0403  AST 45* 17  ALT 28 17  ALKPHOS 67 62  BILITOT 1.9* 0.8  PROT 6.5 5.8*  ALBUMIN 3.5 3.2*   No results for input(s): LIPASE, AMYLASE in the last 168 hours. No results for input(s): AMMONIA in the last 168 hours. CBC: Recent Labs  Lab 06/01/17 0108 06/01/17 0420 06/02/17 0403 06/03/17 0518  WBC 6.5 5.5 5.3 5.5  NEUTROABS 2.7  --  2.2  --   HGB 15.0 14.3 13.6 15.0  HCT 43.6 43.0 42.5 45.0  MCV 82.4 83.0 84.2 83.3  PLT 225 200 183 204   Cardiac Enzymes: Recent Labs  Lab 06/01/17 0138  TROPONINI <0.03   BNP: BNP (last 3 results) Recent Labs    06/01/17 0108  BNP 21.3    ProBNP (last 3 results) No results for input(s): PROBNP in the last 8760 hours.  CBG: No results for input(s): GLUCAP in the last 168 hours.     Signed:  Ramiro Harvest MD.  [facility] 06/03/2017, 12:51 PM

## 2017-06-03 NOTE — Care Management Note (Signed)
Case Management Note  Patient Details  Name: Raymond Valencia MRN: [id number] Date of Birth: [birth year]  Subjective/Objective:MATCH program used for scripts excluding narcotic-tramadol-informed patient of excluded meds-patient understands but is not accepting of the criteria. Also informed of 1x/use for 12 calendar months/$3 co pay-in cannot afford-will override, select pharmacies. MD also informed. Patient [place] & he has no way of getting there-CSW provided w/2 bus passes. Also provided patient w/good http://[email or web address]-[email or web address]/rx.com discount coupon for tramadol-less than $8 @ select pharmacies.  I have called [facility] ot[facility] pharmacy about Tramadol cost is $17.95-patient states he cannot afford, but he will work it out w/family. No further CM needs.               Action/Plan:d/c home.   Expected Discharge Date:  06/03/17               Expected Discharge Plan:  Home/Self Care  In-House Referral:     Discharge planning Services  CM Consult, [facility]  Post Acute Care Choice:    Choice offered to:  Patient  DME Arranged:    DME Agency:     HH Arranged:    HH Agency:     Status of Service:  Completed, signed off  If discussed at [employer] Meetings, dates discussed:    Additional Comments:  Lanier ClamMahabir, Alexandrea Westergard, RN 06/03/2017, 1:39 PM

## 2017-06-03 NOTE — Progress Notes (Signed)
Dr Janee Mornhompson in to see pt and informed pt of CT results.

## 2017-06-03 NOTE — Progress Notes (Signed)
PT Cancellation Note / Screen  Patient Details Name: Raymond Valencia MRN: [id number] DOB: 11/03/1970   Cancelled Treatment:    Reason Eval/Treat Not Completed: PT screened, no needs identified, will sign off Pt ambulating in hallways per nursing staff.  MD okay with discontinuing order.  PT to sign off.   Kitt Ledet,KATHrine E 06/03/2017, 10:25 AM Zenovia JarredKati Taos Tapp, PT, DPT 06/03/2017 Pager: [phone]

## 2017-06-03 NOTE — Progress Notes (Signed)
Per d/w Dr. Delton SeeNelson, cardiac CT showed minimal CAD with low calcium score of 2, no obstructive disease. I wrote care order informing nurse (since result not yet available in Epic) and to make patient aware of findings. Given LDL of 130 she recommends medical therapy with atorvastatin (at least 40mg ) and baby aspirin 81mg  daily - this should be initiated when IM discharges patient. Further evaluation of noncardiac chest pain per primary team. Will arrange 3 month follow-up and sign off. Patient should f/u primary care for blood pressure management.  Bethanie Bloxom PA-C

## 2017-06-03 NOTE — Progress Notes (Signed)
OT Cancellation Note  Patient Details Name: Raymond Valencia MRN: [id number] DOB: 12/12/1970   Cancelled Treatment:    Reason Eval/Treat Not Completed: OT screened, no needs identified, will sign off  Raymond Valencia, Dorena BodoLorraine D  [name], [place] [phone] 06/03/2017, 11:37 AM

## 2017-06-03 NOTE — Progress Notes (Signed)
Patient complaining of 8/10 chest pressure tonight. BP 138/72 HR 65 oxy 100% RA. Nitro SL was given once. When Rn went back to reassess patient stated chest pain was a 1. On call made aware. No new orders given.

## 2017-06-03 NOTE — Progress Notes (Signed)
All d/c instructions ,prescriptions and belongings given w/ verbal understanding.Family at bs.bus passes given per pt request.d/c to home ambulating per pt request.

## 2017-08-29 NOTE — Progress Notes (Deleted)
Cardiology Office Note    Date:  08/29/2017   ID:  Raymond Valencia, DOB [birth year], MRN [id number]  PCP:  Harlon Ditty, MD  Cardiologist:  Tobias Alexander, MD   No chief complaint on file.   History of Present Illness:  Raymond Valencia is a 47 y.o. male with history of hypertension and chronic low back pain presented to the ER on 06/03/2017 with complaint of chest pain. Ruled out for ACS.  2D echo with a EF of 60-65%, no wall motion abnormalities, mild LVH.  Patient was to get a Myoview stress test done 06/02/2017, however patient had complaints of chest pain as such IV stress test was canceled per cardiology.   A coronary CT was ordered which per cardiology showed minimal coronary artery disease with a low calcium score of 2, no obstructive disease.  Fasting lipid panel done had a LDL of 130 and patient was started on a statin on discharge in addition to a baby aspirin. Patient's enalapril was changed to lisinopril 40 mg daily and hydralazine increased to 75 mg 3 times daily per cardiology. Patient was continued on his HCTZ as well as home regimen of Coreg.  Per  Cardiology to avoid imdur as nitroglycerin gave patient headaches.    08/30/2017 - 3 months follow up, first visit with me    Past Medical History:  Diagnosis Date  . Chronic back pain   . Coronary artery disease   . Hyperlipidemia 06/03/2017  . Hypertension     Past Surgical History:  Procedure Laterality Date  . CARDIAC SURGERY      Current Medications: Outpatient Medications Prior to Visit  Medication Sig Dispense Refill  . aspirin 81 MG chewable tablet Chew 81 mg by mouth daily.    Marland Kitchen atorvastatin (LIPITOR) 40 MG tablet Take 1 tablet (40 mg total) by mouth daily. 30 tablet 0  . carvedilol (COREG) 12.5 MG tablet Take 1 tablet (12.5 mg total) by mouth 2 (two) times daily with a meal. 60 tablet 0  . hydrALAZINE (APRESOLINE) 25 MG tablet Take 3 tablets (75 mg total) by mouth every 8 (eight) hours. 270 tablet 0   . hydrochlorothiazide (HYDRODIURIL) 25 MG tablet Take 1 tablet (25 mg total) by mouth daily. 30 tablet 0  . lisinopril (PRINIVIL,ZESTRIL) 40 MG tablet Take 1 tablet (40 mg total) by mouth daily. 30 tablet 0  . nicotine (NICODERM CQ - DOSED IN MG/24 HOURS) 21 mg/24hr patch Place 1 patch (21 mg total) onto the skin daily. 28 patch 0  . nitroGLYCERIN (NITROSTAT) 0.4 MG SL tablet Place 0.4 mg under the tongue every 5 (five) minutes as needed for chest pain.    . pantoprazole (PROTONIX) 40 MG tablet Take 1 tablet (40 mg total) by mouth daily at 6 (six) AM. 30 tablet 0  . traMADol (ULTRAM) 50 MG tablet Take 1 tablet (50 mg total) by mouth every 6 (six) hours as needed. (Patient taking differently: Take 50 mg by mouth every 6 (six) hours as needed for moderate pain. ) 15 tablet 0   No facility-administered medications prior to visit.      Allergies:   Patient has no known allergies.   Social History   Socioeconomic History  . Marital status: Married    Spouse name: Not on file  . Number of children: Not on file  . Years of education: Not on file  . Highest education level: Not on file  Social Needs  . Financial resource strain: Not on  file  . Food insecurity - worry: Not on file  . Food insecurity - inability: Not on file  . Transportation needs - medical: Not on file  . Transportation needs - non-medical: Not on file  Occupational History  . Not on file  Tobacco Use  . Smoking status: Current Every Day Smoker    Types: Cigars  . Smokeless tobacco: Never Used  Substance and Sexual Activity  . Alcohol use: No    Frequency: Never  . Drug use: No    Comment: former  . Sexual activity: Yes  Other Topics Concern  . Not on file  Social History Narrative  . Not on file     Family History:  The patient's ***family history includes Bone cancer in his brother; Diabetes in his mother; Hypertension in his mother, other, sister, sister, sister, and sister.   ROS:   Please see the history  of present illness.    ROS All other systems reviewed and are negative.   PHYSICAL EXAM:   VS:  There were no vitals taken for this visit.   GEN: Well nourished, well developed, in no acute distress  HEENT: normal  Neck: no JVD, carotid bruits, or masses Cardiac: ***RRR; no murmurs, rubs, or gallops,no edema  Respiratory:  clear to auscultation bilaterally, normal work of breathing GI: soft, nontender, nondistended, + BS MS: no deformity or atrophy  Skin: warm and dry, no rash Neuro:  Alert and Oriented x 3, Strength and sensation are intact Psych: euthymic mood, full affect  Wt Readings from Last 3 Encounters:  06/01/17 241 lb 16 oz (109.8 kg)  05/31/17 240 lb (108.9 kg)  03/01/17 242 lb (109.8 kg)      Studies/Labs Reviewed:   EKG:  EKG is*** ordered today.  The ekg ordered today demonstrates ***  Recent Labs: 06/01/2017: B Natriuretic Peptide 21.3 06/02/2017: ALT 17; Magnesium 2.2 06/03/2017: BUN 10; Creatinine, Ser 0.91; Hemoglobin 15.0; Platelets 204; Potassium 3.5; Sodium 136   Lipid Panel    Component Value Date/Time   CHOL 190 06/03/2017 0518   TRIG 96 06/03/2017 0518   HDL 41 06/03/2017 0518   CHOLHDL 4.6 06/03/2017 0518   VLDL 19 06/03/2017 0518   LDLCALC 130 (H) 06/03/2017 0518    Additional studies/ records that were reviewed today include:  ***    ASSESSMENT:    No diagnosis found.   PLAN:  In order of problems listed above:    #1 chest pain Normal echo, minimal CAD on coronary CTA, calcium score 2.   2.  HTN  3.  Chronic back pain States had run out of his medications.  Patient currently receiving tramadol as needed as well as oxycodone as needed.    Patient will be discharged to follow-up with his PCP.  Patient was able to get in touch with his PCPs office for refills on his tramadol.  Outpatient follow-up.  #4 elevated LDL Patient was noted on fasting lipid panel to have a LDL of 130.  It was recommended per cardiology that patient  be started on Lipitor 40 mg daily with outpatient follow-up.  Medication Adjustments/Labs and Tests Ordered: Current medicines are reviewed at length with the patient today.  Concerns regarding medicines are outlined above.  Medication changes, Labs and Tests ordered today are listed in the Patient Instructions below. There are no Patient Instructions on file for this visit.   Signed, Tobias AlexanderKatarina Weslee Fogg, MD  08/29/2017 6:47 PM    [facility] [phone]  [address] [place], [place]  [address] Phone: [phone]; Fax: [phone]

## 2017-08-30 ENCOUNTER — Ambulatory Visit: Payer: Self-pay | Admitting: Cardiology

## 2017-08-30 DIAGNOSIS — R0989 Other specified symptoms and signs involving the circulatory and respiratory systems: Secondary | ICD-10-CM

## 2017-08-31 ENCOUNTER — Encounter: Payer: Self-pay | Admitting: Cardiology

## 2019-05-15 IMAGING — CR DG CHEST 2V
2 series · 2 of 2 positions shown · non-contrast
Comparison: 03/01/2017

CLINICAL DATA: Chest pain and dyspnea for 48 hours.

EXAM:
CHEST  2 VIEW

[w chest pa]
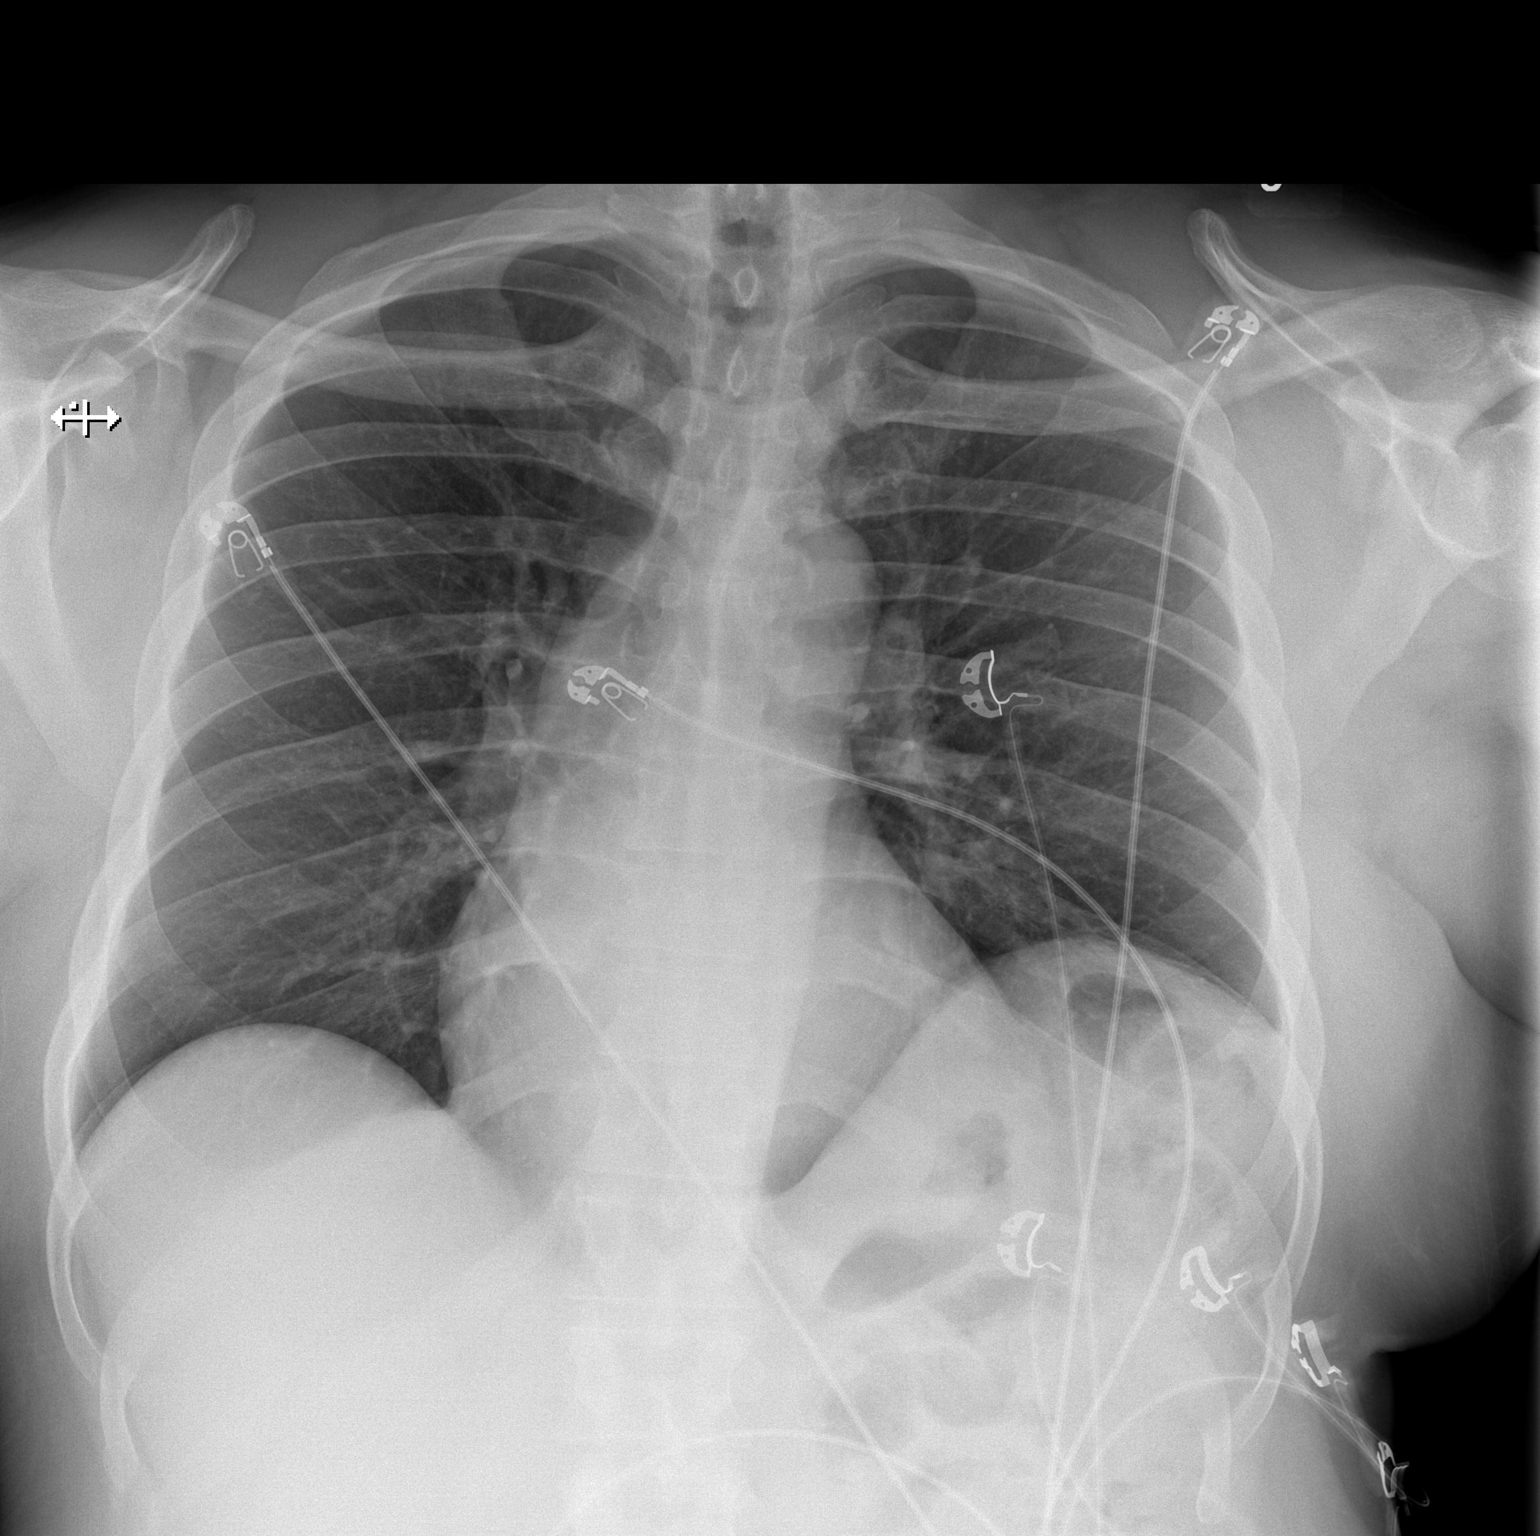

[w chest lat]
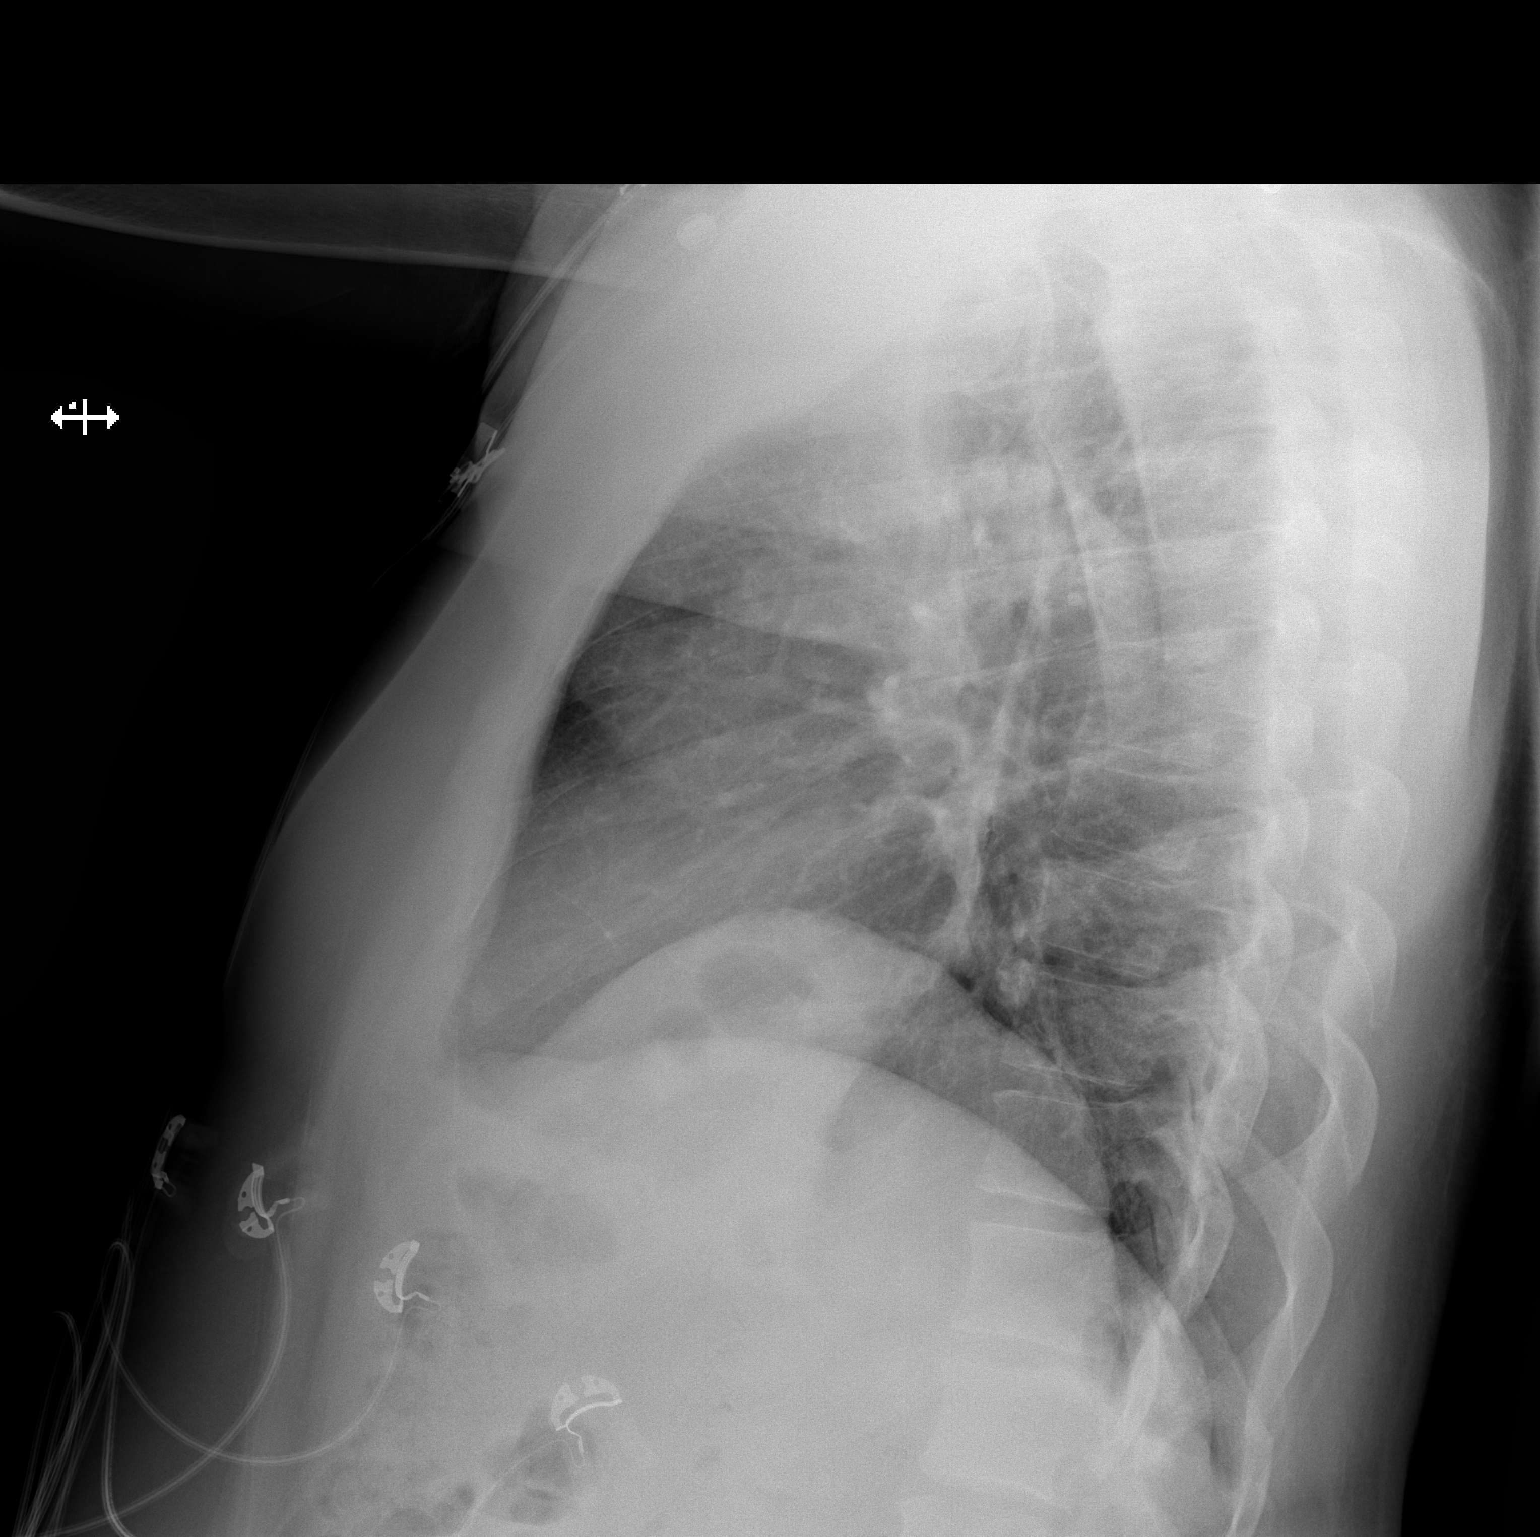

[2 of 2 positions shown; findings below may reference images not displayed]

FINDINGS: Stable left hemidiaphragm elevation. The lungs are clear. The
pulmonary vasculature is normal. Hilar, mediastinal and cardiac
contours are unremarkable and unchanged. No pleural effusion.
IMPRESSION: No active cardiopulmonary disease.

## 2019-05-16 IMAGING — CT CT HEART MORP W/ CTA COR W/ SCORE W/ CA W/CM &/OR W/O CM
4 of 7 series · 8 of 20 positions shown, 9 images · IV contrast (APPLIED)
Comparison: None.

CLINICAL DATA: 46 -year-old male with atypical chest pain.

EXAM:
Cardiac/Coronary  CT
TECHNIQUE: The patient was scanned on a [employer] scanner.

[Series 7: best diast 71 % · axial · 0.39mm/px · z∈[-230,-179]mm · 2 of 378 slices shown, 3 images]
[im 126/378  vessel]
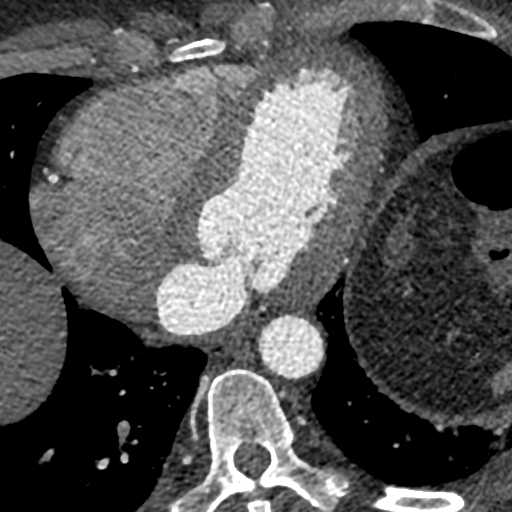
[im 126/378  lung]
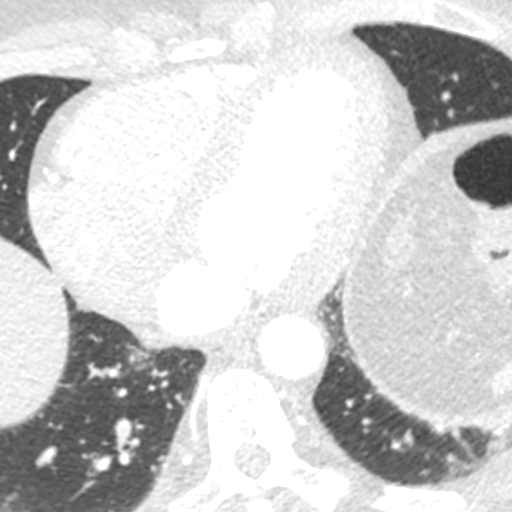
[im 252/378  vessel]
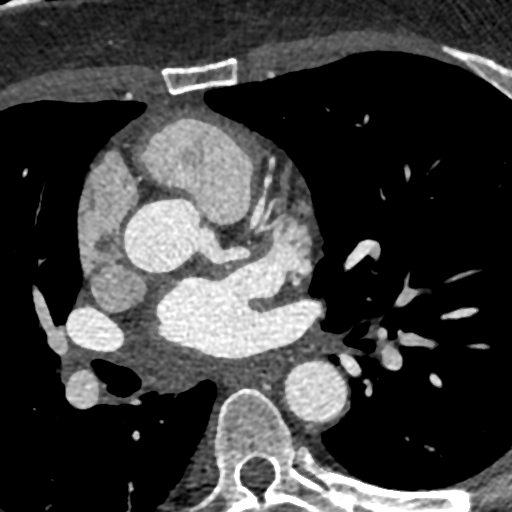

[Series 8: best syst 52 % · axial · 0.39mm/px · z∈[-230,-179]mm · 2 of 378 slices shown]
[im 126/378  vessel]
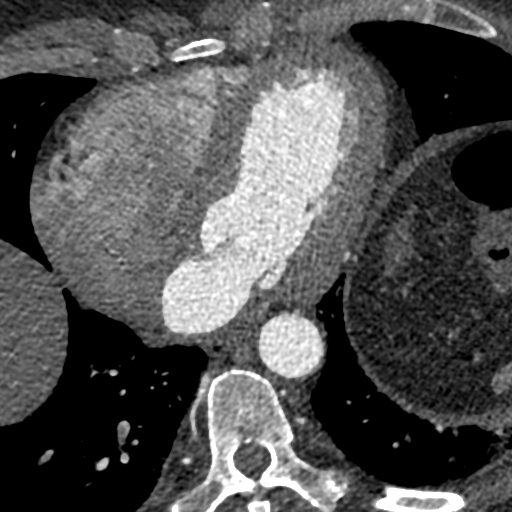
[im 252/378  vessel]
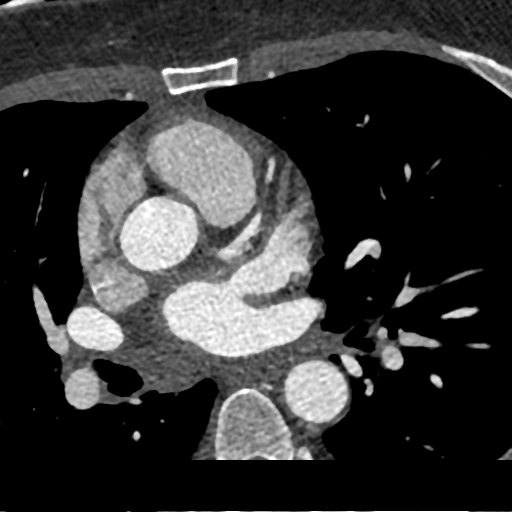

[Series 9: ts diast sharp 71 % · axial · 0.39mm/px · z∈[-230,-179]mm · 2 of 378 slices shown]
[im 126/378  lung]
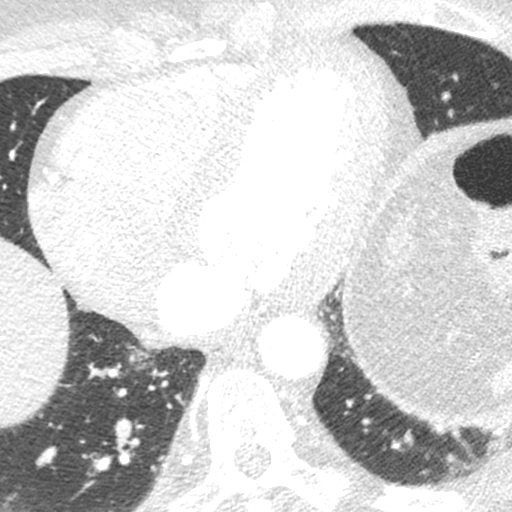
[im 252/378  lung]
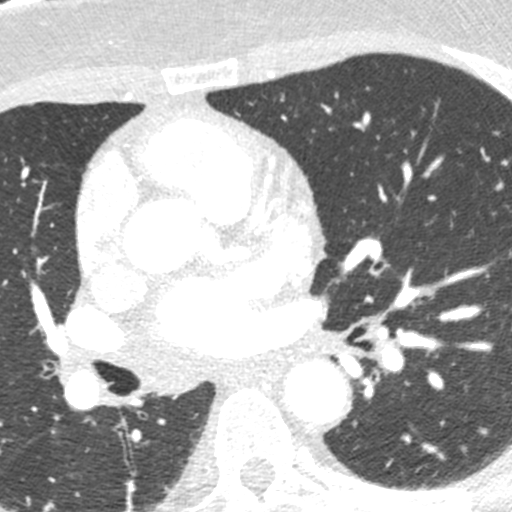

[Series 10: ts syst sharp 52 % · axial · 0.39mm/px · z∈[-230,-179]mm · 2 of 378 slices shown]
[im 126/378  lung]
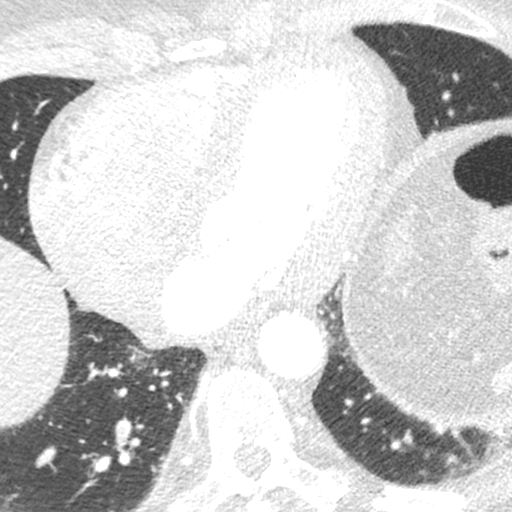
[im 252/378  lung]
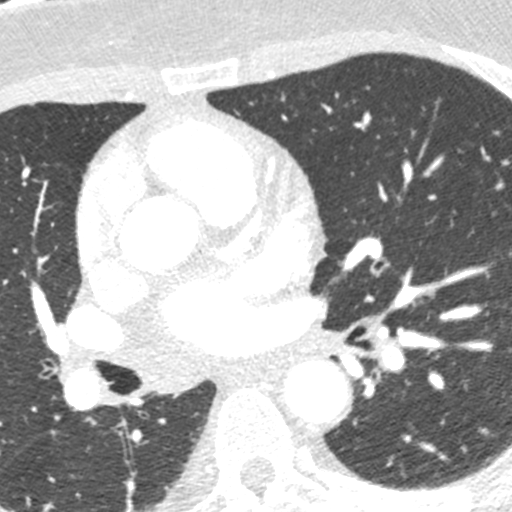

[8 of 20 positions shown; findings below may reference images not displayed]



Aorta:  Normal size.  No calcifications.  No dissection.

Aortic Valve:  Trileaflet.  No calcifications.

Coronary Arteries:  Normal coronary origin.  Right dominance.

RCA is a large dominant artery that gives rise to PDA and a small
PLA. There is no plaque.

Left main is a large artery that gives rise to LAD, ramus
intermedius and LCX arteries.

LAD is a large vessel that gives rise to two small diagonal arteries
and has no plaque.

RI is a small artery that has no plaque.

LCX is a non-dominant artery that gives rise to one large OM1
branch. There is minimal calcified plaque in the proximal portion
with associated stenosis 0-25%.

Other findings:

Normal pulmonary vein drainage into the left atrium.

Normal let atrial appendage without a thrombus.

Mildly dilated pulmonary artery measuring 30 mm.
IMPRESSION: 1. Coronary calcium score of 2. This was 78 percentile for age and
sex matched control.

2. Normal coronary origin with right dominance.

3. Minimal non-obstructive CAD.

4. Mildly dilated pulmonary artery measuring 30 mm.

EXAM:
OVER-READ INTERPRETATION  CT CHEST

The following report is an over-read performed by radiologist Dr.
Marinaldo Antoneli [[email or web address]] on 06/02/2017. This over-read
does not include interpretation of cardiac or coronary anatomy or
pathology. The coronary CTA interpretation by the cardiologist is
attached.
FINDINGS: Vascular: Heart is borderline in size.  Aorta is normal caliber.

Mediastinum/Nodes: No adenopathy in the lower mediastinum or hila.

Lungs/Pleura: Minimal dependent and bibasilar atelectasis. No
effusions.

Upper Abdomen: Imaging into the upper abdomen shows no acute
findings.

Musculoskeletal: Chest wall soft tissues are unremarkable. No acute
bony abnormality.
IMPRESSION: No acute or significant extracardiac abnormality.
